# Patient Record
Sex: Male | Born: 1940 | Race: White | Hispanic: No | Marital: Married | Smoking: Former smoker
Health system: Southern US, Community
[De-identification: ages and names within clinical notes are randomized; demographics above are authoritative.]

## PROBLEM LIST (undated history)

## (undated) DIAGNOSIS — I499 Cardiac arrhythmia, unspecified: Secondary | ICD-10-CM

## (undated) DIAGNOSIS — M1990 Unspecified osteoarthritis, unspecified site: Secondary | ICD-10-CM

## (undated) DIAGNOSIS — I4891 Unspecified atrial fibrillation: Secondary | ICD-10-CM

## (undated) HISTORY — PX: APPENDECTOMY: SHX54

## (undated) HISTORY — PX: TONSILLECTOMY: SUR1361

## (undated) HISTORY — DX: Unspecified atrial fibrillation: I48.91

---

## 2014-08-19 ENCOUNTER — Other Ambulatory Visit: Payer: Self-pay | Admitting: Orthopedic Surgery

## 2014-09-02 NOTE — Pre-Procedure Instructions (Signed)
Leland HerRichard Carrigan  09/02/2014   Your procedure is scheduled on:  09/13/14  [facility] Admitting at 530 AM.  Call this number if you have problems the morning of surgery: [phone]   Remember:   Do not eat food or drink liquids after midnight.   Take these medicines the morning of surgery with A SIP OF WATER: none   Do not wear jewelry, make-up or nail polish.  Do not wear lotions, powders, or perfumes. You may wear deodorant.  Do not shave 48 hours prior to surgery. Men may shave face and neck.  Do not bring valuables to the hospital.  [facility] is not responsible                  for any belongings or valuables.               Contacts, dentures or bridgework may not be worn into surgery.  Leave suitcase in the car. After surgery it may be brought to your room.  For patients admitted to the hospital, discharge time is determined by your                treatment team.               Patients discharged the day of surgery will not be allowed to drive  home.  Name and phone number of your driver: family  Special Instructions: Shower using CHG 2 nights before surgery and the night before surgery.  If you shower the day of surgery use CHG.  Use special wash - you have one bottle of CHG for all showers.  You should use approximately 1/3 of the bottle for each shower.   Please read over the following fact sheets that you were given: Pain Booklet, Coughing and Deep Breathing, Blood Transfusion Information, MRSA Information and Surgical Site Infection Prevention

## 2014-09-03 ENCOUNTER — Encounter (HOSPITAL_COMMUNITY): Payer: Self-pay

## 2014-09-03 ENCOUNTER — Encounter (HOSPITAL_COMMUNITY)
Admission: RE | Admit: 2014-09-03 | Discharge: 2014-09-03 | Disposition: A | Discharge status: Home / Self Care | Payer: Medicare Other | Source: Ambulatory Visit | Attending: Orthopedic Surgery | Admitting: Orthopedic Surgery

## 2014-09-03 DIAGNOSIS — I493 Ventricular premature depolarization: Secondary | ICD-10-CM | POA: Diagnosis not present

## 2014-09-03 DIAGNOSIS — Z01818 Encounter for other preprocedural examination: Secondary | ICD-10-CM | POA: Insufficient documentation

## 2014-09-03 DIAGNOSIS — I771 Stricture of artery: Secondary | ICD-10-CM | POA: Diagnosis not present

## 2014-09-03 DIAGNOSIS — M1990 Unspecified osteoarthritis, unspecified site: Secondary | ICD-10-CM | POA: Diagnosis not present

## 2014-09-03 DIAGNOSIS — M5134 Other intervertebral disc degeneration, thoracic region: Secondary | ICD-10-CM | POA: Insufficient documentation

## 2014-09-03 DIAGNOSIS — I4891 Unspecified atrial fibrillation: Secondary | ICD-10-CM | POA: Diagnosis not present

## 2014-09-03 DIAGNOSIS — J449 Chronic obstructive pulmonary disease, unspecified: Secondary | ICD-10-CM | POA: Insufficient documentation

## 2014-09-03 DIAGNOSIS — F17210 Nicotine dependence, cigarettes, uncomplicated: Secondary | ICD-10-CM | POA: Insufficient documentation

## 2014-09-03 DIAGNOSIS — I482 Chronic atrial fibrillation: Secondary | ICD-10-CM | POA: Insufficient documentation

## 2014-09-03 DIAGNOSIS — I517 Cardiomegaly: Secondary | ICD-10-CM | POA: Insufficient documentation

## 2014-09-03 DIAGNOSIS — Z7901 Long term (current) use of anticoagulants: Secondary | ICD-10-CM | POA: Diagnosis not present

## 2014-09-03 HISTORY — DX: Cardiac arrhythmia, unspecified: I49.9

## 2014-09-03 HISTORY — DX: Unspecified osteoarthritis, unspecified site: M19.90

## 2014-09-03 LAB — COMPREHENSIVE METABOLIC PANEL
ALT: 18 U/L (ref 0–53)
ANION GAP: 6 (ref 5–15)
AST: 25 U/L (ref 0–37)
Albumin: 3.8 g/dL (ref 3.5–5.2)
Alkaline Phosphatase: 56 U/L (ref 39–117)
BUN: 15 mg/dL (ref 6–23)
CHLORIDE: 106 meq/L (ref 96–112)
CO2: 27 mmol/L (ref 19–32)
CREATININE: 1.19 mg/dL (ref 0.50–1.35)
Calcium: 9 mg/dL (ref 8.4–10.5)
GFR calc Af Amer: 68 mL/min — ABNORMAL LOW (ref >90)
GFR calc non Af Amer: 58 mL/min — ABNORMAL LOW (ref >90)
Glucose, Bld: 103 mg/dL — ABNORMAL HIGH (ref 70–99)
Potassium: 4.4 mmol/L (ref 3.5–5.1)
Sodium: 139 mmol/L (ref 135–145)
Total Bilirubin: 0.8 mg/dL (ref 0.3–1.2)
Total Protein: 6.7 g/dL (ref 6.0–8.3)

## 2014-09-03 LAB — CBC WITH DIFFERENTIAL/PLATELET
BASOS PCT: 0 % (ref 0–1)
Basophils Absolute: 0 10*3/uL (ref 0.0–0.1)
EOS ABS: 0.2 10*3/uL (ref 0.0–0.7)
Eosinophils Relative: 3 % (ref 0–5)
HEMATOCRIT: 44.9 % (ref 39.0–52.0)
Hemoglobin: 15.5 g/dL (ref 13.0–17.0)
Lymphocytes Relative: 30 % (ref 12–46)
Lymphs Abs: 1.8 10*3/uL (ref 0.7–4.0)
MCH: 33.1 pg (ref 26.0–34.0)
MCHC: 34.5 g/dL (ref 30.0–36.0)
MCV: 95.9 fL (ref 78.0–100.0)
MONO ABS: 0.4 10*3/uL (ref 0.1–1.0)
Monocytes Relative: 7 % (ref 3–12)
NEUTROS ABS: 3.5 10*3/uL (ref 1.7–7.7)
Neutrophils Relative %: 60 % (ref 43–77)
Platelets: 134 10*3/uL — ABNORMAL LOW (ref 150–400)
RBC: 4.68 MIL/uL (ref 4.22–5.81)
RDW: 13.4 % (ref 11.5–15.5)
WBC: 6 10*3/uL (ref 4.0–10.5)

## 2014-09-03 LAB — URINALYSIS, ROUTINE W REFLEX MICROSCOPIC
BILIRUBIN URINE: NEGATIVE
GLUCOSE, UA: NEGATIVE mg/dL
HGB URINE DIPSTICK: NEGATIVE
Ketones, ur: NEGATIVE mg/dL
Leukocytes, UA: NEGATIVE
Nitrite: NEGATIVE
PH: 5.5 (ref 5.0–8.0)
PROTEIN: 30 mg/dL — AB
SPECIFIC GRAVITY, URINE: 1.02 (ref 1.005–1.030)
Urobilinogen, UA: 0.2 mg/dL (ref 0.0–1.0)

## 2014-09-03 LAB — PROTIME-INR
INR: 2.02 — ABNORMAL HIGH (ref 0.00–1.49)
PROTHROMBIN TIME: 23.1 s — AB (ref 11.6–15.2)

## 2014-09-03 LAB — TYPE AND SCREEN
ABO/RH(D): AB POS
Antibody Screen: NEGATIVE

## 2014-09-03 LAB — URINE MICROSCOPIC-ADD ON

## 2014-09-03 LAB — APTT: APTT: 35 s (ref 24–37)

## 2014-09-03 LAB — SURGICAL PCR SCREEN
MRSA, PCR: NEGATIVE
STAPHYLOCOCCUS AUREUS: NEGATIVE

## 2014-09-03 LAB — ABO/RH: ABO/RH(D): AB POS

## 2014-09-03 NOTE — Progress Notes (Signed)
Will request ekg,stress test from dr Valentina LucksGriffin

## 2014-09-04 NOTE — Progress Notes (Signed)
Anesthesia Chart Review:  Patient is a 73 year old male scheduled for left TKA on 09/13/13 by Dr. Luiz BlareGraves.  History includes smoking, chronic afib (on Coumadin), arthritis, tonsillectomy, appendectomy. BMI is 29.39. PCP is Dr. Kirby FunkJohn Griffin. No current cardiologist.  09/03/14 EKG: afib at 61 bpm, occasional PVC, ST/T wave abnormality, consider inferolateral ischemia. Inferior T wave abnormality appears stable with lateral T wave abnormality slightly more pronounced since comparison EKG from Dr. Jone BasemanGriffin's office (no date, but patient's age then was listed as 64 years).   CXR 09/03/14: COPD. There is mild enlargement of the cardiac silhouette with prominence of the central pulmonary vascularity which may reflect low-grade CHF. There is no evidence of pneumonia.  Preoperative labs noted. He reported being told to hold Coumadin 5 days prior to surgery. Repeat PT/INR preoperatively.    I reviewed history, EKGs and last PCP office note with anesthesiologist Dr. Aleene DavidsonE. Fitzgerald.  Patient with chronic afib and no CV symptoms at his last office visit.  His EKG is felt to be overall stable.  Anesthesiologist recommendation to contact patient, and if no new CV/CHF symptoms then plan to proceed.  I did call patient.  He reports that he was diagnosed with afib 15-20 years ago when he lived in [place].  He said his work-up then included an echocardiogram.  He reports a stress test later but this was > 5 years ago.  [place] and has been seeing Dr. Valentina LucksGriffin for ~ 9 years.  He continues to deny chest pain, SOB, edema, significant palpitations.  He goes to the gym 3 days per week and is able to ride a recubant bicycle for 30 minutes at a time without CV symptoms.  No known CAD or CHF.    Based on above, anticipate that if no acute changes then he can proceed as planned.  Definitive anesthesia plan to be determined on the day of surgery following anesthesiologist evaluation.  Velna Ochsllison Zelenak, PA-C [facility]  Short Stay Center/Anesthesiology Phone [phone] 09/04/2014 3:36 PM

## 2014-09-12 MED ORDER — CEFAZOLIN SODIUM-DEXTROSE 2-3 GM-% IV SOLR
2.0000 g | INTRAVENOUS | Status: AC
  Administered 2014-09-13: 2 g via INTRAVENOUS
  Filled 2014-09-12: qty 50

## 2014-09-12 MED ORDER — CHLORHEXIDINE GLUCONATE 4 % EX LIQD
60.0000 mL | Freq: Once | CUTANEOUS | Status: DC
  Filled 2014-09-12: qty 60

## 2014-09-13 ENCOUNTER — Encounter (HOSPITAL_COMMUNITY)
Admission: RE | Disposition: A | Discharge status: Home / Self Care | Payer: Self-pay | Source: Ambulatory Visit | Attending: Orthopedic Surgery

## 2014-09-13 ENCOUNTER — Inpatient Hospital Stay (HOSPITAL_COMMUNITY)
Admission: RE | Admit: 2014-09-13 | Discharge: 2014-09-16 | DRG: 470 | Disposition: A | Discharge status: Home / Self Care | Payer: Medicare Other | Source: Ambulatory Visit | Attending: Orthopedic Surgery | Admitting: Orthopedic Surgery

## 2014-09-13 ENCOUNTER — Encounter (HOSPITAL_COMMUNITY): Payer: Self-pay | Admitting: *Deleted

## 2014-09-13 ENCOUNTER — Inpatient Hospital Stay (HOSPITAL_COMMUNITY): Payer: Medicare Other | Admitting: Vascular Surgery

## 2014-09-13 ENCOUNTER — Inpatient Hospital Stay (HOSPITAL_COMMUNITY): Payer: Medicare Other | Admitting: Anesthesiology

## 2014-09-13 DIAGNOSIS — M1712 Unilateral primary osteoarthritis, left knee: Secondary | ICD-10-CM | POA: Diagnosis present

## 2014-09-13 DIAGNOSIS — F17210 Nicotine dependence, cigarettes, uncomplicated: Secondary | ICD-10-CM | POA: Diagnosis present

## 2014-09-13 DIAGNOSIS — R339 Retention of urine, unspecified: Secondary | ICD-10-CM | POA: Diagnosis present

## 2014-09-13 DIAGNOSIS — F1010 Alcohol abuse, uncomplicated: Secondary | ICD-10-CM | POA: Diagnosis present

## 2014-09-13 DIAGNOSIS — M25562 Pain in left knee: Secondary | ICD-10-CM | POA: Diagnosis present

## 2014-09-13 HISTORY — PX: TOTAL KNEE ARTHROPLASTY: SHX125

## 2014-09-13 LAB — PROTIME-INR
INR: 1.05 (ref 0.00–1.49)
Prothrombin Time: 13.8 seconds (ref 11.6–15.2)

## 2014-09-13 SURGERY — ARTHROPLASTY, KNEE, TOTAL
Anesthesia: Spinal | Site: Knee | Laterality: Left

## 2014-09-13 MED ORDER — BUPIVACAINE LIPOSOME 1.3 % IJ SUSP
INTRAMUSCULAR | Status: DC | PRN
  Administered 2014-09-13: 20 mL

## 2014-09-13 MED ORDER — PROPOFOL INFUSION 10 MG/ML OPTIME
INTRAVENOUS | Status: DC | PRN
  Administered 2014-09-13: 150 ug/kg/min via INTRAVENOUS

## 2014-09-13 MED ORDER — OXYCODONE-ACETAMINOPHEN 5-325 MG PO TABS: 1.0000 | ORAL_TABLET | Freq: Four times a day (QID) | ORAL | Status: DC | PRN

## 2014-09-13 MED ORDER — MIDAZOLAM HCL 2 MG/2ML IJ SOLN
INTRAMUSCULAR | Status: AC
Start: 2014-09-13 — End: 2014-09-13
  Filled 2014-09-13: qty 2

## 2014-09-13 MED ORDER — HYDROMORPHONE HCL 1 MG/ML IJ SOLN
1.0000 mg | Freq: Once | INTRAMUSCULAR | Status: AC
  Administered 2014-09-13: 1 mg via INTRAVENOUS

## 2014-09-13 MED ORDER — METHOCARBAMOL 1000 MG/10ML IJ SOLN
500.0000 mg | Freq: Four times a day (QID) | INTRAVENOUS | Status: DC | PRN
  Filled 2014-09-13: qty 5

## 2014-09-13 MED ORDER — SODIUM CHLORIDE 0.9 % IV SOLN
INTRAVENOUS | Status: DC
  Administered 2014-09-13 (×2): via INTRAVENOUS

## 2014-09-13 MED ORDER — TRANEXAMIC ACID 100 MG/ML IV SOLN
2000.0000 mg | INTRAVENOUS | Status: AC
  Administered 2014-09-13: 2000 mg via TOPICAL
  Filled 2014-09-13: qty 20

## 2014-09-13 MED ORDER — METHOCARBAMOL 500 MG PO TABS
500.0000 mg | ORAL_TABLET | Freq: Four times a day (QID) | ORAL | Status: DC | PRN
  Administered 2014-09-13 – 2014-09-16 (×8): 500 mg via ORAL
  Filled 2014-09-13 (×7): qty 1

## 2014-09-13 MED ORDER — PROMETHAZINE HCL 25 MG/ML IJ SOLN: 6.2500 mg | INTRAMUSCULAR | Status: DC | PRN

## 2014-09-13 MED ORDER — ACETAMINOPHEN 650 MG RE SUPP: 650.0000 mg | Freq: Four times a day (QID) | RECTAL | Status: DC | PRN

## 2014-09-13 MED ORDER — ONDANSETRON HCL 4 MG PO TABS: 4.0000 mg | ORAL_TABLET | Freq: Four times a day (QID) | ORAL | Status: DC | PRN

## 2014-09-13 MED ORDER — OXYCODONE-ACETAMINOPHEN 5-325 MG PO TABS
ORAL_TABLET | ORAL | Status: AC
  Filled 2014-09-13: qty 2

## 2014-09-13 MED ORDER — KETOROLAC TROMETHAMINE 15 MG/ML IJ SOLN
15.0000 mg | Freq: Once | INTRAMUSCULAR | Status: AC
  Administered 2014-09-13: 15 mg via INTRAVENOUS

## 2014-09-13 MED ORDER — ACETAMINOPHEN 325 MG PO TABS: 650.0000 mg | ORAL_TABLET | Freq: Four times a day (QID) | ORAL | Status: DC | PRN

## 2014-09-13 MED ORDER — PROPOFOL 10 MG/ML IV BOLUS
INTRAVENOUS | Status: AC
Start: 2014-09-13 — End: 2014-09-13
  Filled 2014-09-13: qty 40

## 2014-09-13 MED ORDER — PHENYLEPHRINE HCL 10 MG/ML IJ SOLN
INTRAMUSCULAR | Status: DC | PRN
  Administered 2014-09-13: 80 ug via INTRAVENOUS

## 2014-09-13 MED ORDER — HYDROMORPHONE HCL 1 MG/ML IJ SOLN
0.2500 mg | INTRAMUSCULAR | Status: DC | PRN
  Administered 2014-09-13: 1 mg via INTRAVENOUS

## 2014-09-13 MED ORDER — BUPIVACAINE LIPOSOME 1.3 % IJ SUSP
20.0000 mL | Freq: Once | INTRAMUSCULAR | Status: DC
  Filled 2014-09-13: qty 20

## 2014-09-13 MED ORDER — LIDOCAINE HCL (CARDIAC) 20 MG/ML IV SOLN
INTRAVENOUS | Status: AC
  Filled 2014-09-13: qty 5

## 2014-09-13 MED ORDER — ALUM & MAG HYDROXIDE-SIMETH 200-200-20 MG/5ML PO SUSP
30.0000 mL | ORAL | Status: DC | PRN
  Administered 2014-09-14: 30 mL via ORAL
  Filled 2014-09-13: qty 30

## 2014-09-13 MED ORDER — TRANEXAMIC ACID 100 MG/ML IV SOLN
1000.0000 mg | INTRAVENOUS | Status: DC
  Filled 2014-09-13: qty 10

## 2014-09-13 MED ORDER — SUFENTANIL CITRATE 50 MCG/ML IV SOLN
INTRAVENOUS | Status: DC | PRN
  Administered 2014-09-13: 5 ug via INTRAVENOUS

## 2014-09-13 MED ORDER — BISACODYL 5 MG PO TBEC: 5.0000 mg | DELAYED_RELEASE_TABLET | Freq: Every day | ORAL | Status: DC | PRN

## 2014-09-13 MED ORDER — HYDROMORPHONE HCL 1 MG/ML IJ SOLN
INTRAMUSCULAR | Status: AC
  Filled 2014-09-13: qty 1

## 2014-09-13 MED ORDER — HYDROMORPHONE HCL 1 MG/ML IJ SOLN
1.0000 mg | INTRAMUSCULAR | Status: DC | PRN
  Administered 2014-09-14: 2 mg via INTRAVENOUS
  Filled 2014-09-13: qty 2

## 2014-09-13 MED ORDER — SUFENTANIL CITRATE 50 MCG/ML IV SOLN
INTRAVENOUS | Status: AC
  Filled 2014-09-13: qty 1

## 2014-09-13 MED ORDER — SODIUM CHLORIDE 0.9 % IR SOLN
Status: DC | PRN
  Administered 2014-09-13 (×3): 1000 mL

## 2014-09-13 MED ORDER — FLEET ENEMA 7-19 GM/118ML RE ENEM: 1.0000 | ENEMA | Freq: Once | RECTAL | Status: AC | PRN

## 2014-09-13 MED ORDER — WARFARIN SODIUM 5 MG PO TABS
5.0000 mg | ORAL_TABLET | ORAL | Status: DC
  Administered 2014-09-13 – 2014-09-16 (×2): 5 mg via ORAL
  Filled 2014-09-13 (×2): qty 1

## 2014-09-13 MED ORDER — PHENYLEPHRINE 40 MCG/ML (10ML) SYRINGE FOR IV PUSH (FOR BLOOD PRESSURE SUPPORT)
PREFILLED_SYRINGE | INTRAVENOUS | Status: AC
  Filled 2014-09-13: qty 10

## 2014-09-13 MED ORDER — BUPIVACAINE IN DEXTROSE 0.75-8.25 % IT SOLN
INTRATHECAL | Status: DC | PRN
  Administered 2014-09-13: 15 mg via INTRATHECAL

## 2014-09-13 MED ORDER — WARFARIN - PHYSICIAN DOSING INPATIENT: Freq: Every day | Status: DC

## 2014-09-13 MED ORDER — CEFAZOLIN SODIUM-DEXTROSE 2-3 GM-% IV SOLR
2.0000 g | Freq: Four times a day (QID) | INTRAVENOUS | Status: AC
  Administered 2014-09-13 (×2): 2 g via INTRAVENOUS
  Filled 2014-09-13 (×2): qty 50

## 2014-09-13 MED ORDER — KETOROLAC TROMETHAMINE 15 MG/ML IJ SOLN
INTRAMUSCULAR | Status: AC
  Administered 2014-09-13: 15 mg
  Filled 2014-09-13: qty 1

## 2014-09-13 MED ORDER — LACTATED RINGERS IV SOLN
INTRAVENOUS | Status: DC | PRN
  Administered 2014-09-13: 07:00:00 via INTRAVENOUS

## 2014-09-13 MED ORDER — SODIUM CHLORIDE 0.9 % IJ SOLN
INTRAMUSCULAR | Status: AC
  Filled 2014-09-13: qty 10

## 2014-09-13 MED ORDER — HYDROMORPHONE HCL 1 MG/ML IJ SOLN: 0.5000 mg | INTRAMUSCULAR | Status: DC | PRN

## 2014-09-13 MED ORDER — DIPHENHYDRAMINE HCL 12.5 MG/5ML PO ELIX: 12.5000 mg | ORAL_SOLUTION | ORAL | Status: DC | PRN

## 2014-09-13 MED ORDER — SUCCINYLCHOLINE CHLORIDE 20 MG/ML IJ SOLN
INTRAMUSCULAR | Status: AC
  Filled 2014-09-13: qty 1

## 2014-09-13 MED ORDER — MIDAZOLAM HCL 5 MG/5ML IJ SOLN
INTRAMUSCULAR | Status: DC | PRN
  Administered 2014-09-13: 2 mg via INTRAVENOUS

## 2014-09-13 MED ORDER — EPHEDRINE SULFATE 50 MG/ML IJ SOLN
INTRAMUSCULAR | Status: AC
  Filled 2014-09-13: qty 1

## 2014-09-13 MED ORDER — WARFARIN SODIUM 2.5 MG PO TABS: 2.5000 mg | ORAL_TABLET | Freq: Every day | ORAL | Status: DC

## 2014-09-13 MED ORDER — SODIUM CHLORIDE 0.9 % IJ SOLN
INTRAMUSCULAR | Status: AC
Start: 2014-09-13 — End: 2014-09-13
  Filled 2014-09-13: qty 10

## 2014-09-13 MED ORDER — METHOCARBAMOL 500 MG PO TABS
ORAL_TABLET | ORAL | Status: AC
  Filled 2014-09-13: qty 1

## 2014-09-13 MED ORDER — OXYCODONE-ACETAMINOPHEN 5-325 MG PO TABS
1.0000 | ORAL_TABLET | ORAL | Status: DC | PRN
  Administered 2014-09-13 – 2014-09-16 (×13): 2 via ORAL
  Filled 2014-09-13 (×13): qty 2

## 2014-09-13 MED ORDER — BUPIVACAINE HCL (PF) 0.5 % IJ SOLN
INTRAMUSCULAR | Status: DC | PRN
  Administered 2014-09-13: 20 mL

## 2014-09-13 MED ORDER — WARFARIN SODIUM 2.5 MG PO TABS
2.5000 mg | ORAL_TABLET | ORAL | Status: DC
  Administered 2014-09-14 – 2014-09-15 (×2): 2.5 mg via ORAL
  Filled 2014-09-13 (×2): qty 1

## 2014-09-13 MED ORDER — ROCURONIUM BROMIDE 50 MG/5ML IV SOLN
INTRAVENOUS | Status: AC
Start: 2014-09-13 — End: 2014-09-13
  Filled 2014-09-13: qty 1

## 2014-09-13 MED ORDER — ONDANSETRON HCL 4 MG/2ML IJ SOLN: 4.0000 mg | Freq: Four times a day (QID) | INTRAMUSCULAR | Status: DC | PRN

## 2014-09-13 MED ORDER — ONDANSETRON HCL 4 MG/2ML IJ SOLN
INTRAMUSCULAR | Status: DC | PRN
  Administered 2014-09-13: 4 mg via INTRAVENOUS

## 2014-09-13 MED ORDER — METHOCARBAMOL 500 MG PO TABS: 500.0000 mg | ORAL_TABLET | Freq: Three times a day (TID) | ORAL | Status: DC | PRN

## 2014-09-13 MED ORDER — POLYETHYLENE GLYCOL 3350 17 G PO PACK: 17.0000 g | PACK | Freq: Every day | ORAL | Status: DC | PRN

## 2014-09-13 MED ORDER — PHENYLEPHRINE HCL 10 MG/ML IJ SOLN
10.0000 mg | INTRAVENOUS | Status: DC | PRN
  Administered 2014-09-13: 40 ug/min via INTRAVENOUS

## 2014-09-13 MED ORDER — DOCUSATE SODIUM 100 MG PO CAPS
100.0000 mg | ORAL_CAPSULE | Freq: Two times a day (BID) | ORAL | Status: DC
  Administered 2014-09-13 – 2014-09-16 (×5): 100 mg via ORAL
  Filled 2014-09-13 (×6): qty 1

## 2014-09-13 MED ORDER — PROMETHAZINE HCL 25 MG/ML IJ SOLN: 6.2500 mg | Freq: Four times a day (QID) | INTRAMUSCULAR | Status: DC | PRN

## 2014-09-13 SURGICAL SUPPLY — 70 items
BAG DECANTER FOR FLEXI CONT (MISCELLANEOUS) ×3 IMPLANT
BANDAGE ESMARK 6X9 LF (GAUZE/BANDAGES/DRESSINGS) ×1 IMPLANT
BENZOIN TINCTURE PRP APPL 2/3 (GAUZE/BANDAGES/DRESSINGS) ×3 IMPLANT
BLADE SAGITTAL 25.0X1.19X90 (BLADE) ×2 IMPLANT
BLADE SAGITTAL 25.0X1.19X90MM (BLADE) ×1
BLADE SAW SAG 90X13X1.27 (BLADE) ×3 IMPLANT
BNDG ELASTIC 6X10 VLCR STRL LF (GAUZE/BANDAGES/DRESSINGS) ×3 IMPLANT
BNDG ESMARK 6X9 LF (GAUZE/BANDAGES/DRESSINGS) ×3
BOWL SMART MIX CTS (DISPOSABLE) ×3 IMPLANT
CAP KNEE TOTAL 3 SIGMA ×3 IMPLANT
CEMENT HV SMART SET (Cement) ×6 IMPLANT
CLOSURE WOUND 1/2 X4 (GAUZE/BANDAGES/DRESSINGS) ×1
COVER SURGICAL LIGHT HANDLE (MISCELLANEOUS) ×3 IMPLANT
CUFF TOURNIQUET SINGLE 34IN LL (TOURNIQUET CUFF) ×3 IMPLANT
CUFF TOURNIQUET SINGLE 44IN (TOURNIQUET CUFF) IMPLANT
DRAPE EXTREMITY T 121X128X90 (DRAPE) ×3 IMPLANT
DRAPE IMP U-DRAPE 54X76 (DRAPES) ×3 IMPLANT
DRAPE U-SHAPE 47X51 STRL (DRAPES) ×3 IMPLANT
DRSG MEPILEX BORDER 4X8 (GAUZE/BANDAGES/DRESSINGS) ×3 IMPLANT
DRSG PAD ABDOMINAL 8X10 ST (GAUZE/BANDAGES/DRESSINGS) ×3 IMPLANT
DURAPREP 26ML APPLICATOR (WOUND CARE) ×3 IMPLANT
ELECT REM PT RETURN 9FT ADLT (ELECTROSURGICAL) ×3
ELECTRODE REM PT RTRN 9FT ADLT (ELECTROSURGICAL) ×1 IMPLANT
EVACUATOR 1/8 PVC DRAIN (DRAIN) ×3 IMPLANT
FACESHIELD WRAPAROUND (MASK) ×3 IMPLANT
GAUZE SPONGE 4X4 12PLY STRL (GAUZE/BANDAGES/DRESSINGS) ×3 IMPLANT
GAUZE XEROFORM 5X9 LF (GAUZE/BANDAGES/DRESSINGS) ×3 IMPLANT
GLOVE BIO SURGEON STRL SZ 6.5 (GLOVE) ×2 IMPLANT
GLOVE BIO SURGEONS STRL SZ 6.5 (GLOVE) ×1
GLOVE BIOGEL PI IND STRL 6.5 (GLOVE) ×1 IMPLANT
GLOVE BIOGEL PI IND STRL 7.5 (GLOVE) ×1 IMPLANT
GLOVE BIOGEL PI IND STRL 8 (GLOVE) ×2 IMPLANT
GLOVE BIOGEL PI INDICATOR 6.5 (GLOVE) ×2
GLOVE BIOGEL PI INDICATOR 7.5 (GLOVE) ×2
GLOVE BIOGEL PI INDICATOR 8 (GLOVE) ×4
GLOVE ECLIPSE 7.5 STRL STRAW (GLOVE) ×6 IMPLANT
GLOVE SURG SS PI 6.5 STRL IVOR (GLOVE) ×3 IMPLANT
GOWN STRL REUS W/ TWL LRG LVL3 (GOWN DISPOSABLE) ×1 IMPLANT
GOWN STRL REUS W/ TWL XL LVL3 (GOWN DISPOSABLE) ×3 IMPLANT
GOWN STRL REUS W/TWL LRG LVL3 (GOWN DISPOSABLE) ×2
GOWN STRL REUS W/TWL XL LVL3 (GOWN DISPOSABLE) ×6
HANDPIECE INTERPULSE COAX TIP (DISPOSABLE) ×2
HOOD PEEL AWAY FACE SHEILD DIS (HOOD) ×6 IMPLANT
IMMOBILIZER KNEE 20 (SOFTGOODS) IMPLANT
IMMOBILIZER KNEE 22 UNIV (SOFTGOODS) ×3 IMPLANT
KIT BASIN OR (CUSTOM PROCEDURE TRAY) ×3 IMPLANT
KIT ROOM TURNOVER OR (KITS) ×3 IMPLANT
MANIFOLD NEPTUNE II (INSTRUMENTS) ×3 IMPLANT
NEEDLE SPNL 22GX3.5 QUINCKE BK (NEEDLE) ×3 IMPLANT
NS IRRIG 1000ML POUR BTL (IV SOLUTION) ×3 IMPLANT
PACK TOTAL JOINT (CUSTOM PROCEDURE TRAY) ×3 IMPLANT
PACK UNIVERSAL I (CUSTOM PROCEDURE TRAY) ×3 IMPLANT
PAD ARMBOARD 7.5X6 YLW CONV (MISCELLANEOUS) ×3 IMPLANT
PAD CAST 4YDX4 CTTN HI CHSV (CAST SUPPLIES) ×1 IMPLANT
PADDING CAST COTTON 4X4 STRL (CAST SUPPLIES) ×2
PADDING CAST COTTON 6X4 STRL (CAST SUPPLIES) ×3 IMPLANT
SET HNDPC FAN SPRY TIP SCT (DISPOSABLE) ×1 IMPLANT
STAPLER VISISTAT 35W (STAPLE) IMPLANT
STRIP CLOSURE SKIN 1/2X4 (GAUZE/BANDAGES/DRESSINGS) ×2 IMPLANT
SUCTION FRAZIER TIP 10 FR DISP (SUCTIONS) ×3 IMPLANT
SUT MNCRL AB 3-0 PS2 18 (SUTURE) ×3 IMPLANT
SUT VIC AB 0 CTB1 27 (SUTURE) ×6 IMPLANT
SUT VIC AB 1 CT1 27 (SUTURE) ×4
SUT VIC AB 1 CT1 27XBRD ANBCTR (SUTURE) ×2 IMPLANT
SUT VIC AB 2-0 CTB1 (SUTURE) ×6 IMPLANT
SYR 50ML LL SCALE MARK (SYRINGE) ×3 IMPLANT
SYR CONTROL 10ML LL (SYRINGE) IMPLANT
SYRINGE 20CC LL (MISCELLANEOUS) ×3 IMPLANT
TOWEL OR 17X24 6PK STRL BLUE (TOWEL DISPOSABLE) ×3 IMPLANT
TOWEL OR 17X26 10 PK STRL BLUE (TOWEL DISPOSABLE) ×3 IMPLANT

## 2014-09-13 NOTE — Discharge Instructions (Signed)
Total Knee Replacement, Care After °Refer to this sheet in the next few weeks. These instructions provide you with information on caring for yourself after your procedure. Your health care provider also may give you specific instructions. Your treatment has been planned according to the most current medical practices, but problems sometimes occur. Call your health care provider if you have any problems or questions after your procedure. °HOME CARE INSTRUCTIONS  °· See a physical therapist as directed by your health care provider. °· Take medicines only as directed by your health care provider. °· Avoid lifting or driving until you are instructed otherwise. °· If you have been sent home with a continuous passive motion machine, use it as directed by your health care provider. °SEEK MEDICAL CARE IF: °· You have difficulty breathing. °· You have drainage, redness, swelling, or pain at your incision site. °· You have a bad smell coming from your incision site. °· You have persistent bleeding from your incision site. °· Your incision breaks open after sutures (stitches) or staples have been removed. °· You have a fever. °SEEK IMMEDIATE MEDICAL CARE IF:  °· You have a rash. °· You have pain or swelling in your calf or thigh. °· You have shortness of breath or chest pain. °· Your range of motion in your knee is decreasing rather than increasing. °MAKE SURE YOU:  °· Understand these instructions. °· Will watch your condition. °· Will get help right away if you are not doing well or get worse. °Document Released: 03/12/2005 Document Revised: 01/07/2014 Document Reviewed: 10/12/2011 °ExitCare® Patient Information ©2015 [employer]. This information is not intended to replace advice given to you by your health care provider. Make sure you discuss any questions you have with your health care provider. ° °

## 2014-09-13 NOTE — Plan of Care (Signed)
Problem: Consults Goal: Diagnosis- Total Joint Replacement Primary Total Knee Left     

## 2014-09-13 NOTE — Brief Op Note (Signed)
09/13/2014  9:21 AM  PATIENT:  Jesse Quinn  74 y.o. male  PRE-OPERATIVE DIAGNOSIS:  degenerative joint disease, left knee  POST-OPERATIVE DIAGNOSIS:  degenerative joint disease, left knee  PROCEDURE:  Procedure(s): TOTAL KNEE ARTHROPLASTY (Left)  SURGEON:  Surgeon(s) and Role:    * Harvie JuniorJohn L Lennie Vasco, MD - Primary  PHYSICIAN ASSISTANT:   ASSISTANTS: bethune   ANESTHESIA:   general  EBL:  Total I/O In: 1700 [I.V.:1700] Out: 200 [Blood:200]  BLOOD ADMINISTERED:none  DRAINS: (1) Hemovact drain(s) in the l knee with  Suction Open   LOCAL MEDICATIONS USED:  MARCAINE     SPECIMEN:  No Specimen  DISPOSITION OF SPECIMEN:  N/A  COUNTS:  YES  TOURNIQUET:   Total Tourniquet Time Documented: Thigh (Left) - 56 minutes Total: Thigh (Left) - 56 minutes   DICTATION: .Other Dictation: Dictation Number [id number]  PLAN OF CARE: Admit to inpatient   PATIENT DISPOSITION:  PACU - hemodynamically stable.   Delay start of Pharmacological VTE agent (>24hrs) due to surgical blood loss or risk of bleeding: no

## 2014-09-13 NOTE — Evaluation (Signed)
Physical Therapy Evaluation Patient Details Name: Jesse Quinn MRN: [id number] DOB: [birth year] Today's Date: 09/13/2014   History of Present Illness  Patient is a 74 y/o male s/p L TKA.  Clinical Impression  Patient presents with decreased independence with mobility due to deficits listed in PT problem list.  He will benefit from skilled PT in the acute setting to allow return home with family assist and HHPT.    Follow Up Recommendations Home health PT;Supervision for mobility/OOB    Equipment Recommendations  Rolling walker with 5" wheels    Recommendations for Other Services       Precautions / Restrictions Precautions Precautions: Knee Required Braces or Orthoses: Knee Immobilizer - Left Knee Immobilizer - Left: On when out of bed or walking      Mobility  Bed Mobility Overal bed mobility: Modified Independent (HOB elevated)                Transfers Overall transfer level: Needs assistance Equipment used: Rolling walker (2 wheeled) Transfers: Sit to/from [employer] Sit to Stand: Min assist Stand pivot transfers: Min assist       General transfer comment: assist for safety, cues for technique; pivot with walker to chair cues for UEweight bearing due to numbness in LLE; no noted buckling  Ambulation/Gait                Stairs            Wheelchair Mobility    Modified Rankin (Stroke Patients Only)       Balance Overall balance assessment: Needs assistance         Standing balance support: Bilateral upper extremity supported Standing balance-Leahy Scale: Poor Standing balance comment: due to LLE weakness                             Pertinent Vitals/Pain Pain Assessment: No/denies pain (reports leg still tingling and numb)    Home Living Family/patient expects to be discharged to:: Private residence Living Arrangements: Spouse/significant other Available Help at Discharge: Family Type of Home:  House Home Access: Stairs to enter Entrance Stairs-Rails: None Secretary/administrator of Steps: 3 Home Layout: One level Home Equipment: None      Prior Function Level of Independence: Independent               Hand Dominance        Extremity/Trunk Assessment               Lower Extremity Assessment: RLE deficits/detail;LLE deficits/detail RLE Deficits / Details: WFL LLE Deficits / Details: AAROM 0-approx70  degrees left knee; quad strength at least 3/5     Communication   Communication: No difficulties  Cognition Arousal/Alertness: Awake/alert Behavior During Therapy: WFL for tasks assessed/performed Overall Cognitive Status: Within Functional Limits for tasks assessed                      General Comments      Exercises Total Joint Exercises Ankle Circles/Pumps: AROM;Both;10 reps;Supine Quad Sets: AROM;Both;10 reps;Supine Short Arc Quad: AROM;Left;10 reps;Supine Heel Slides: AAROM;Left;10 reps;Supine;AROM      Assessment/Plan    PT Assessment Patient needs continued PT services  PT Diagnosis Difficulty walking;Acute pain   PT Problem List Decreased strength;Decreased mobility;Decreased range of motion;Decreased activity tolerance;Decreased balance;Pain;Decreased knowledge of use of DME;Decreased knowledge of precautions  PT Treatment Interventions DME instruction;Therapeutic exercise;Gait training;Stair training;Functional mobility training;Therapeutic activities;Patient/family education   PT  Goals (Current goals can be found in the Care Plan section) Acute Rehab PT Goals Patient Stated Goal: To return to independent PT Goal Formulation: With patient Time For Goal Achievement: 09/19/14 Potential to Achieve Goals: Good    Frequency 7X/week   Barriers to discharge        Co-evaluation               End of Session Equipment Utilized During Treatment: Gait belt;Left knee immobilizer Activity Tolerance: Patient tolerated  treatment well Patient left: in chair;with call bell/phone within reach           Time: 1515-1545 PT Time Calculation (min) (ACUTE ONLY): 30 min   Charges:   PT Evaluation $Initial PT Evaluation Tier I: 1 Procedure PT Treatments $Therapeutic Exercise: 8-22 mins $Therapeutic Activity: 8-22 mins   PT G Codes:        Younis Mathey,CYNDI 09/13/2014, 4:48 PM Sheran Lawlessyndi Slaton Reaser, PT [phone] 09/13/2014

## 2014-09-13 NOTE — Progress Notes (Signed)
Orthopedic Tech Progress Note Patient Details:  Jesse DoneRichard Quinn 25-Aug-1941 [id number] CPM applied to LLE with appropriate settings. OHF applied to bed.  CPM Left Knee CPM Left Knee: On Left Knee Flexion (Degrees): 90 Left Knee Extension (Degrees): 0   Asia R Thompson 09/13/2014, 10:34 AM

## 2014-09-13 NOTE — Progress Notes (Signed)
Prior to leaving pacu able to bend r knee to 90 degrees and assit in moving about bed . Spinal level for nomal sensation mid thigh on (L) and dr Jacklynn Buemassagee aware/ ok to transfer

## 2014-09-13 NOTE — Anesthesia Postprocedure Evaluation (Signed)
  Anesthesia Post-op Note  Patient: Jesse Quinn  Procedure(s) Performed: Procedure(s): TOTAL KNEE ARTHROPLASTY (Left)  Patient Location: PACU  Anesthesia Type:Spinal  Level of Consciousness: awake and alert   Airway and Oxygen Therapy: Patient Spontanous Breathing  Post-op Pain: mild  Post-op Assessment: Post-op Vital signs reviewed  Post-op Vital Signs: stable  Last Vitals:  Filed Vitals:   09/13/14 1000  BP: 106/58  Pulse: 40  Temp:   Resp: 17    Complications: No apparent anesthesia complications

## 2014-09-13 NOTE — Anesthesia Procedure Notes (Addendum)
Spinal Patient location during procedure: OR Start time: 09/13/2014 7:20 AM End time: 09/13/2014 7:30 AM Preanesthetic Checklist Completed: patient identified, site marked, surgical consent, pre-op evaluation, timeout performed, IV checked, risks and benefits discussed and monitors and equipment checked Spinal Block Patient position: sitting Prep: Betadine Approach: midline Location: L3-4 Needle Needle type: Tuohy  Needle gauge: 25 G Needle length: 9 cm Needle insertion depth: 5 cm Assessment Sensory level: T6 Additional Notes Tolerated well  Date/Time: 09/13/2014 7:35 AM Performed by: Coralee RudFLORES, Wajiha Versteeg Pre-anesthesia Checklist: Patient identified, Emergency Drugs available, Suction available and Patient being monitored Patient Re-evaluated:Patient Re-evaluated prior to inductionOxygen Delivery Method: Simple face mask Intubation Type: IV induction Comments: Natural airway, spontaneous breathing,

## 2014-09-13 NOTE — Anesthesia Preprocedure Evaluation (Signed)
Anesthesia Evaluation  Patient identified by MRN, date of birth, ID band Patient awake    Reviewed: Allergy & Precautions, Patient's Chart, lab work & pertinent test results  Airway        Dental   Pulmonary Current Smoker,  breath sounds clear to auscultation        Cardiovascular + dysrhythmias Rate:Normal     Neuro/Psych    GI/Hepatic negative GI ROS, Neg liver ROS,   Endo/Other  negative endocrine ROS  Renal/GU negative Renal ROS     Musculoskeletal  (+) Arthritis -,   Abdominal   Peds  Hematology   Anesthesia Other Findings   Reproductive/Obstetrics                             Anesthesia Physical Anesthesia Plan  ASA: II  Anesthesia Plan: Spinal   Post-op Pain Management:    Induction:   Airway Management Planned: Natural Airway  Additional Equipment:   Intra-op Plan:   Post-operative Plan:   Informed Consent: I have reviewed the patients History and Physical, chart, labs and discussed the procedure including the risks, benefits and alternatives for the proposed anesthesia with the patient or authorized representative who has indicated his/her understanding and acceptance.   Dental advisory given  Plan Discussed with: CRNA and Surgeon  Anesthesia Plan Comments:         Anesthesia Quick Evaluation

## 2014-09-13 NOTE — H&P (Signed)
TOTAL KNEE ADMISSION H&P  Patient is being admitted for left total knee arthroplasty.  Subjective:  Chief Complaint:left knee pain.  HPI: Jesse Quinn, 74 y.o. male, has a history of pain and functional disability in the left knee due to arthritis and has failed non-surgical conservative treatments for greater than 12 weeks to includeNSAID's and/or analgesics, corticosteriod injections, flexibility and strengthening excercises, use of assistive devices and activity modification.  Onset of symptoms was gradual, starting 7 years ago with gradually worsening course since that time. The patient noted no past surgery on the left knee(s).  Patient currently rates pain in the left knee(s) at 8 out of 10 with activity. Patient has night pain, worsening of pain with activity and weight bearing, pain that interferes with activities of daily living, pain with passive range of motion and joint swelling.  Patient has evidence of subchondral cysts, subchondral sclerosis, joint subluxation and joint space narrowing by imaging studies. This patient has had failure of conservative care. There is no active infection.  There are no active problems to display for this patient.  Past Medical History  Diagnosis Date  . Dysrhythmia     AF     . Arthritis     Past Surgical History  Procedure Laterality Date  . Tonsillectomy    . Appendectomy      Prescriptions prior to admission  Medication Sig Dispense Refill Last Dose  . warfarin (COUMADIN) 5 MG tablet Take 2.5-5 mg by mouth daily. 58m daily on Monday Wednesday and Friday and 2.568mdaily all other days.   Past Week at Unknown time   No Known Allergies  History  Substance Use Topics  . Smoking status: Current Every Day Smoker    Types: Cigars  . Smokeless tobacco: Not on file  . Alcohol Use: Yes     Comment: weekly    History reviewed. No pertinent family history.   ROS ROS: I have reviewed the patient's review of systems thoroughly and there are  no positive responses as relates to the HPI.  Objective:  Physical Exam  Vital signs in last 24 hours: Temp:  [97.9 F (36.6 C)] 97.9 F (36.6 C) (01/08 0557) Pulse Rate:  [50] 50 (01/08 0557) Resp:  [18] 18 (01/08 0557) BP: (134)/(83) 134/83 mmHg (01/08 0557) SpO2:  [100 %] 100 % (01/08 0557) Weight:  [215 lb (97.523 kg)] 215 lb (97.523 kg) (01/08 0557) Well-developed well-nourished patient in no acute distress. Alert and oriented x3 HEENT:within normal limits Cardiac: Regular rate and rhythm Pulmonary: Lungs clear to auscultation Abdomen: Soft and nontender.  Normal active bowel sounds  Musculoskeletal: (L Knee: painful rom// rom 0-100 med jt line tender +varus mal-alignment Labs: Recent Results (from the past 2160 hour(s))  Type and screen     Status: None   Collection Time: 09/03/14  8:35 AM  Result Value Ref Range   ABO/RH(D) AB POS    Antibody Screen NEG    Sample Expiration 09/17/2014   ABO/Rh     Status: None   Collection Time: 09/03/14  8:35 AM  Result Value Ref Range   ABO/RH(D) AB POS   Surgical pcr screen     Status: None   Collection Time: 09/03/14  8:40 AM  Result Value Ref Range   MRSA, PCR NEGATIVE NEGATIVE   Staphylococcus aureus NEGATIVE NEGATIVE    Comment:        The Xpert SA Assay (FDA approved for NASAL specimens in patients over 2189ears of age), is one  component of a comprehensive surveillance program.  Test performance has been validated by [facility] for patients greater than or equal to 87 year old. It is not intended to diagnose infection nor to guide or monitor treatment.   APTT     Status: None   Collection Time: 09/03/14  8:40 AM  Result Value Ref Range   aPTT 35 24 - 37 seconds  CBC WITH DIFFERENTIAL     Status: Abnormal   Collection Time: 09/03/14  8:40 AM  Result Value Ref Range   WBC 6.0 4.0 - 10.5 K/uL   RBC 4.68 4.22 - 5.81 MIL/uL   Hemoglobin 15.5 13.0 - 17.0 g/dL   HCT 44.9 39.0 - 52.0 %   MCV 95.9 78.0 - 100.0  fL   MCH 33.1 26.0 - 34.0 pg   MCHC 34.5 30.0 - 36.0 g/dL   RDW 13.4 11.5 - 15.5 %   Platelets 134 (L) 150 - 400 K/uL   Neutrophils Relative % 60 43 - 77 %   Neutro Abs 3.5 1.7 - 7.7 K/uL   Lymphocytes Relative 30 12 - 46 %   Lymphs Abs 1.8 0.7 - 4.0 K/uL   Monocytes Relative 7 3 - 12 %   Monocytes Absolute 0.4 0.1 - 1.0 K/uL   Eosinophils Relative 3 0 - 5 %   Eosinophils Absolute 0.2 0.0 - 0.7 K/uL   Basophils Relative 0 0 - 1 %   Basophils Absolute 0.0 0.0 - 0.1 K/uL  Comprehensive metabolic panel     Status: Abnormal   Collection Time: 09/03/14  8:40 AM  Result Value Ref Range   Sodium 139 135 - 145 mmol/L    Comment: Please note change in reference range.   Potassium 4.4 3.5 - 5.1 mmol/L    Comment: Please note change in reference range.   Chloride 106 96 - 112 mEq/L   CO2 27 19 - 32 mmol/L   Glucose, Bld 103 (H) 70 - 99 mg/dL   BUN 15 6 - 23 mg/dL   Creatinine, Ser 1.19 0.50 - 1.35 mg/dL   Calcium 9.0 8.4 - 10.5 mg/dL   Total Protein 6.7 6.0 - 8.3 g/dL   Albumin 3.8 3.5 - 5.2 g/dL   AST 25 0 - 37 U/L   ALT 18 0 - 53 U/L   Alkaline Phosphatase 56 39 - 117 U/L   Total Bilirubin 0.8 0.3 - 1.2 mg/dL   GFR calc non Af Amer 58 (L) >90 mL/min   GFR calc Af Amer 68 (L) >90 mL/min    Comment: (NOTE) The eGFR has been calculated using the CKD EPI equation. This calculation has not been validated in all clinical situations. eGFR's persistently <90 mL/min signify possible Chronic Kidney Disease.    Anion gap 6 5 - 15  Protime-INR     Status: Abnormal   Collection Time: 09/03/14  8:40 AM  Result Value Ref Range   Prothrombin Time 23.1 (H) 11.6 - 15.2 seconds   INR 2.02 (H) 0.00 - 1.49  Urinalysis, Routine w reflex microscopic     Status: Abnormal   Collection Time: 09/03/14  8:40 AM  Result Value Ref Range   Color, Urine YELLOW YELLOW   APPearance CLEAR CLEAR   Specific Gravity, Urine 1.020 1.005 - 1.030   pH 5.5 5.0 - 8.0   Glucose, UA NEGATIVE NEGATIVE mg/dL   Hgb  urine dipstick NEGATIVE NEGATIVE   Bilirubin Urine NEGATIVE NEGATIVE   Ketones, ur NEGATIVE NEGATIVE mg/dL  Protein, ur 30 (A) NEGATIVE mg/dL   Urobilinogen, UA 0.2 0.0 - 1.0 mg/dL   Nitrite NEGATIVE NEGATIVE   Leukocytes, UA NEGATIVE NEGATIVE  Urine microscopic-add on     Status: None   Collection Time: 09/03/14  8:40 AM  Result Value Ref Range   Squamous Epithelial / LPF RARE RARE   WBC, UA 0-2 <3 WBC/hpf   RBC / HPF 0-2 <3 RBC/hpf   Bacteria, UA RARE RARE  Protime-INR     Status: None   Collection Time: 09/13/14  5:48 AM  Result Value Ref Range   Prothrombin Time 13.8 11.6 - 15.2 seconds   INR 1.05 0.00 - 1.49    Estimated body mass index is 28.37 kg/(m^2) as calculated from the following:   Height as of this encounter: 6' 1"  (1.854 m).   Weight as of this encounter: 215 lb (97.523 kg).   Imaging Review Plain radiographs demonstrate severe degenerative joint disease of the left knee(s). The overall alignment ismild varus. The bone quality appears to be good for age and reported activity level.  Assessment/Plan:  End stage arthritis, left knee   The patient history, physical examination, clinical judgment of the provider and imaging studies are consistent with end stage degenerative joint disease of the left knee(s) and total knee arthroplasty is deemed medically necessary. The treatment options including medical management, injection therapy arthroscopy and arthroplasty were discussed at length. The risks and benefits of total knee arthroplasty were presented and reviewed. The risks due to aseptic loosening, infection, stiffness, patella tracking problems, thromboembolic complications and other imponderables were discussed. The patient acknowledged the explanation, agreed to proceed with the plan and consent was signed. Patient is being admitted for inpatient treatment for surgery, pain control, PT, OT, prophylactic antibiotics, VTE prophylaxis, progressive ambulation and ADL's  and discharge planning. The patient is planning to be discharged home with home health services

## 2014-09-13 NOTE — Transfer of Care (Signed)
Immediate Anesthesia Transfer of Care Note  Patient: Jesse Quinn  Procedure(s) Performed: Procedure(s): TOTAL KNEE ARTHROPLASTY (Left)  Patient Location: PACU  Anesthesia T Spinal  Level of Consciousness: awake, oriented and patient cooperative  Airway & Oxygen Therapy: Patient Spontanous Breathing and Patient connected to nasal cannula oxygen  Post-op Assessment: Report given to PACU RN, Post -op Vital signs reviewed and stable and Patient moving all extremities  Post vital signs: Reviewed and stable  Complications: No apparent anesthesia complications

## 2014-09-14 LAB — CBC
HCT: 37.4 % — ABNORMAL LOW (ref 39.0–52.0)
HEMOGLOBIN: 12 g/dL — AB (ref 13.0–17.0)
MCH: 31.3 pg (ref 26.0–34.0)
MCHC: 32.1 g/dL (ref 30.0–36.0)
MCV: 97.4 fL (ref 78.0–100.0)
Platelets: 126 10*3/uL — ABNORMAL LOW (ref 150–400)
RBC: 3.84 MIL/uL — ABNORMAL LOW (ref 4.22–5.81)
RDW: 13.6 % (ref 11.5–15.5)
WBC: 8 10*3/uL (ref 4.0–10.5)

## 2014-09-14 LAB — PROTIME-INR
INR: 1.18 (ref 0.00–1.49)
Prothrombin Time: 15.1 seconds (ref 11.6–15.2)

## 2014-09-14 LAB — BASIC METABOLIC PANEL
Anion gap: 4 — ABNORMAL LOW (ref 5–15)
BUN: 17 mg/dL (ref 6–23)
CALCIUM: 8.1 mg/dL — AB (ref 8.4–10.5)
CO2: 28 mmol/L (ref 19–32)
Chloride: 102 mEq/L (ref 96–112)
Creatinine, Ser: 1.33 mg/dL (ref 0.50–1.35)
GFR calc Af Amer: 59 mL/min — ABNORMAL LOW (ref >90)
GFR calc non Af Amer: 51 mL/min — ABNORMAL LOW (ref >90)
GLUCOSE: 122 mg/dL — AB (ref 70–99)
POTASSIUM: 5 mmol/L (ref 3.5–5.1)
Sodium: 134 mmol/L — ABNORMAL LOW (ref 135–145)

## 2014-09-14 MED ORDER — BETHANECHOL CHLORIDE 25 MG PO TABS
25.0000 mg | ORAL_TABLET | Freq: Three times a day (TID) | ORAL | Status: DC
  Administered 2014-09-14 – 2014-09-16 (×6): 25 mg via ORAL
  Filled 2014-09-14 (×9): qty 1

## 2014-09-14 NOTE — Progress Notes (Signed)
Physical Therapy Treatment Patient Details Name: Hassell DoneRichard Duran MRN: [id number] DOB: 10-Feb-1941 Today's Date: 09/14/2014    History of Present Illness Patient is a 74 y/o male s/p L TKA.    PT Comments    Patient progressing well with mobility and should be safe for d/c tomorrow from PT standpoint.    Follow Up Recommendations  Home health PT;Supervision for mobility/OOB     Equipment Recommendations  Rolling walker with 5" wheels    Recommendations for Other Services       Precautions / Restrictions Precautions Precautions: Knee Precaution Comments: did not use knee immobilizer this session Restrictions LLE Weight Bearing: Weight bearing as tolerated    Mobility  Bed Mobility Overal bed mobility: Modified Independent                Transfers Overall transfer level: Needs assistance Equipment used: Rolling walker (2 wheeled) Transfers: Sit to/from Stand Sit to Stand: Min assist         General transfer comment: cueing for sequencing and hand placement  Ambulation/Gait Ambulation/Gait assistance: Min guard Ambulation Distance (Feet): 100 Feet Assistive device: Rolling walker (2 wheeled) Gait Pattern/deviations: Step-to pattern Gait velocity: decreased   General Gait Details: good pace and sequencing, slightly unsteady at beginning and reported some light headedness   Stairs Stairs: Yes Stairs assistance: Min assist Stair Management: No rails;Backwards;With walker      Wheelchair Mobility    Modified Rankin (Stroke Patients Only)       Balance Overall balance assessment: No apparent balance deficits (not formally assessed)                                  Cognition Arousal/Alertness: Awake/alert Behavior During Therapy: WFL for tasks assessed/performed Overall Cognitive Status: Within Functional Limits for tasks assessed                      Exercises      General Comments        Pertinent Vitals/Pain  Pain Assessment: No/denies pain    Home Living                      Prior Function            PT Goals (current goals can now be found in the care plan section) Progress towards PT goals: Progressing toward goals    Frequency  7X/week    PT Plan Current plan remains appropriate    Co-evaluation             End of Session Equipment Utilized During Treatment: Gait belt Activity Tolerance: Patient tolerated treatment well Patient left: in chair;with call bell/phone within reach;with family/visitor present     Time: 1203-1215 PT Time Calculation (min) (ACUTE ONLY): 12 min  Charges:  $Gait Training: 8-22 mins                    G Codes:      Olivia CanterMoton, Jader Desai M 09/14/2014, 1:36 PM  09/14/2014 Corlis HoveMargie Dalyn Becker, PT [phone]

## 2014-09-14 NOTE — Progress Notes (Signed)
Orthopedic Tech Progress Note Patient Details:  Hassell DoneRichard Andrades [birth year] [id number] On cpm at 8:45 pm Patient ID: Hassell DoneRichard Martian, male   DOB: [birth year], 74 y.o.   MRN: [id number]   Jennye MoccasinHughes, Sharmel Ballantine Craig 09/14/2014, 7:46 PM

## 2014-09-14 NOTE — Progress Notes (Signed)
Physical Therapy Treatment Patient Details Name: Jesse Quinn MRN: [id number] DOB: 1941-04-28 Today's Date: 09/14/2014    History of Present Illness Patient is a 74 y/o male s/p L TKA.    PT Comments    Patient progressing nicely with mobility and ROM.  Feel patient will be ready for d/c on Sun from PT standpoint.  Will benefit from continued PT to progress mobility and ROM.  Will need to review stairs before d/c.  Follow Up Recommendations  Home health PT;Supervision for mobility/OOB     Equipment Recommendations  Rolling walker with 5" wheels    Recommendations for Other Services       Precautions / Restrictions Precautions Precautions: Knee Precaution Comments: did not use knee immobilizer this session Required Braces or Orthoses: Knee Immobilizer - Left Knee Immobilizer - Left: On when out of bed or walking Restrictions LLE Weight Bearing: Weight bearing as tolerated    Mobility  Bed Mobility Overal bed mobility: Modified Independent                Transfers Overall transfer level: Needs assistance Equipment used: Rolling walker (2 wheeled) Transfers: Sit to/from Stand Sit to Stand: Supervision Stand pivot transfers: Supervision       Ambulation/Gait Ambulation/Gait assistance: Supervision Ambulation Distance (Feet): 120 Feet Assistive device: Rolling walker (2 wheeled) Gait Pattern/deviations: Step-through pattern Gait velocity: more steady pace this pm Gait velocity interpretation: Below normal speed for age/gender General Gait Details: still with some light headedness during gait.         Wheelchair Mobility    Modified Rankin (Stroke Patients Only)       Balance Overall balance assessment: No apparent balance deficits (not formally assessed)                                  Cognition Arousal/Alertness: Awake/alert Behavior During Therapy: WFL for tasks assessed/performed Overall Cognitive Status: Within  Functional Limits for tasks assessed                      Exercises Total Joint Exercises Ankle Circles/Pumps: AROM;Both;10 reps;Supine;Strengthening Quad Sets: AROM;Both;10 reps;Supine;Strengthening Gluteal Sets: AROM;Strengthening;Both;10 reps;Supine Heel Slides: Left;10 reps;Supine;AAROM Hip ABduction/ADduction: Strengthening;Left;10 reps;Supine;AAROM Straight Leg Raises: Strengthening;Left;10 reps;AAROM;Supine Long Arc Quad: AAROM;Strengthening;Left;10 reps;Seated Knee Flexion: AAROM;Left;10 reps;Seated Goniometric ROM: flexion = 70, limited by bandage    General Comments        Pertinent Vitals/Pain Pain Assessment: 0-10 Pain Score: 3  Pain Location: knee - during exercises Pain Descriptors / Indicators: Aching Pain Intervention(s): Limited activity within patient's tolerance;Monitored during session    Home Living                      Prior Function            PT Goals (current goals can now be found in the care plan section) Progress towards PT goals: Progressing toward goals    Frequency  7X/week    PT Plan Current plan remains appropriate    Co-evaluation             End of Session Equipment Utilized During Treatment: Gait belt Activity Tolerance: Patient tolerated treatment well Patient left: in bed;in CPM;with call bell/phone within reach;with family/visitor present     Time: 1400-1434 PT Time Calculation (min) (ACUTE ONLY): 34 min  Charges:  $Gait Training: 8-22 mins $Therapeutic Exercise: 8-22 mins  G Codes:      Olivia CanterMoton, Apryll Hinkle M 09/14/2014, 3:25 PM 09/14/2014 Corlis HoveMargie Derrick Orris, PT [phone]

## 2014-09-14 NOTE — Progress Notes (Addendum)
Subjective: 1 Day Post-Op Procedure(s) (LRB): TOTAL KNEE ARTHROPLASTY (Left) Patient reports pain as moderate.  Some difficulty voiding. Had to be in and out cathed.  Objective: Vital signs in last 24 hours: Temp:  [97.6 F (36.4 C)-98.6 F (37 C)] 98.6 F (37 C) (01/09 0530) Pulse Rate:  [40-78] 69 (01/09 0530) Resp:  [16-17] 16 (01/09 0530) BP: (100-111)/(58-75) 100/68 mmHg (01/09 0530) SpO2:  [97 %-100 %] 99 % (01/09 0530)  Intake/Output from previous day: 01/08 0701 - 01/09 0700 In: 2660 [P.O.:960; I.V.:1700] Out: 1210 [Urine:700; Drains:310; Blood:200] Intake/Output this shift:     Recent Labs  09/14/14 0427  HGB 12.0*    Recent Labs  09/14/14 0427  WBC 8.0  RBC 3.84*  HCT 37.4*  PLT 126*    Recent Labs  09/14/14 0427  NA 134*  K 5.0  CL 102  CO2 28  BUN 17  CREATININE 1.33  GLUCOSE 122*  CALCIUM 8.1*    Recent Labs  09/13/14 0548 09/14/14 0427  INR 1.05 1.18   left knee exam:  Neurovascular intact Sensation intact distally Intact pulses distally Dorsiflexion/Plantar flexion intact Incision: dressing C/D/I Compartment soft  Assessment/Plan: 1 Day Post-Op Procedure(s) (LRB): TOTAL KNEE ARTHROPLASTY (Left) Urinary retention. Plan: Start Urecholine 25 mg every 8 hours. Continue aspirin 325 mg twice daily for DVT prophylaxis. With SCDs. Up with therapy Plan for discharge tomorrow Hemovac drain pulled  Najia Hurlbutt G 09/14/2014, 8:48 AM

## 2014-09-14 NOTE — Op Note (Signed)
NAME:  Jesse Quinn, Jesse Quinn NO.:  [id number]  MEDICAL RECORD NO.:  [id number]  LOCATION:  [id number]                        FACILITY:  [facility]  PHYSICIAN:  Harvie Junior, M.D.   DATE OF BIRTH:  07/14/1941  DATE OF PROCEDURE:  09/13/2014 DATE OF DISCHARGE:                              OPERATIVE REPORT   PREOPERATIVE DIAGNOSIS:  End-stage degenerative joint disease, left knee.  POSTOPERATIVE DIAGNOSIS:  End-stage degenerative joint disease, left knee (with left total knee replacement with Sigma system, size 5 femur, size 5 tibia, 12.5 mm bearing, and a 38 mm all polyethylene patella).  SURGEON:  Harvie Junior, MD  ASSISTANT:  Marshia Ly, PA  ANESTHESIA:  General.  BRIEF HISTORY:  Mr. Grieco is a 74 year old male with a long history of significant complaints of left knee pain.  He was treated conservatively for prolonged period of time with activity modification, injection therapy, and physical therapy.  After failure of all these modalities and because of x-ray showing bone-on-bone change and night pain, on an ongoing basis, was taken to the operating room for left total knee replacement.  DESCRIPTION OF PROCEDURE:  The patient was taken to the operating room. After adequate anesthesia was obtained with general anesthetic, the patient was placed supine on the operating table.  Left leg was prepped and draped in usual sterile fashion.  Following this, the leg was exsanguinated.  Blood pressure tourniquet inflated to 350 mmHg. Following this, a midline incision was made and the subcutaneous tissue down the level of extensor mechanism.  Medial parapatellar arthrotomy was undertaken.  Once this was completed, medial and lateral meniscus removed.  Retropatellar fat pad, synovium in the anterior aspect of the femur and the anterior and posterior cruciate retractors were put in place.  The tibia was cut perpendicular to its long axis.  Once this was completed,  attention was turned towards the femur where an intramedullary pilot hole was drilled and the femur was cut at a 5- degree valgus inclination with 11 mm distal bone resection.  Once this was done, the femur was sized to a 5.  It was drilled.  Anterior and posterior cuts were made, chamfers and box.  Attention was then turned towards the tibia which was exposed and it was sized to a 5.  It was drilled and keeled and attention was then turned towards the placement of a 10 mm bridging bearing, but loosely went to the 12.5, went to the patella, cut down to a level of 14 mm.  A 38 paddle was chosen and lugs were drilled and the patella was put in place.  Once this was completed, the trial components were removed.  The knee was copiously and thoroughly lavaged with pulsatile lavage irrigation and the final components were then cemented into place, size 5 femur, size 5 tibia, 12.5 mm bridging bearing trial, and a 38 all poly patella.  Once this was put in place, all cement was allowed to harden and once the cement was completely hardened, the tourniquet was let down.  All bleeding was controlled with electrocautery and a final poly was placed.  A 60 mL of mixture of 40 mL Exparel and 20 mL Marcaine was  instilled in the tissues around the knee for postoperative pain control.  The medial parapatellar arthrotomy was closed with 1 Vicryl running, and once this was closed, 60 mL of 2 g of tranexamic acid and 50 mL of saline was injected into the knee and allowed to sit while the remainder of the closure was performed.  At this time, the drain was opened and all the tranexamic acid was sucked out of the knee at this point.  Following this, after closure of the knee, sterile compressive dressing was applied and the patient was taken to recovery room, was noted to be in satisfactory condition.  Estimated blood loss for procedure was minimal.     Harvie JuniorJohn L. Marylon Verno, M.D.     Ranae PlumberJLG/MEDQ  D:  09/13/2014   T:  09/14/2014  Job:  [id number]

## 2014-09-15 LAB — CBC
HCT: 34.1 % — ABNORMAL LOW (ref 39.0–52.0)
HEMOGLOBIN: 11.4 g/dL — AB (ref 13.0–17.0)
MCH: 32.9 pg (ref 26.0–34.0)
MCHC: 33.4 g/dL (ref 30.0–36.0)
MCV: 98.3 fL (ref 78.0–100.0)
PLATELETS: 110 10*3/uL — AB (ref 150–400)
RBC: 3.47 MIL/uL — ABNORMAL LOW (ref 4.22–5.81)
RDW: 13.5 % (ref 11.5–15.5)
WBC: 8.4 10*3/uL (ref 4.0–10.5)

## 2014-09-15 LAB — PROTIME-INR
INR: 1.37 (ref 0.00–1.49)
PROTHROMBIN TIME: 17 s — AB (ref 11.6–15.2)

## 2014-09-15 MED ORDER — POLYETHYLENE GLYCOL 3350 17 G PO PACK
17.0000 g | PACK | Freq: Every day | ORAL | Status: DC | PRN
  Administered 2014-09-15: 17 g via ORAL
  Filled 2014-09-15: qty 1

## 2014-09-15 MED ORDER — TAMSULOSIN HCL 0.4 MG PO CAPS
0.4000 mg | ORAL_CAPSULE | Freq: Every day | ORAL | Status: DC
  Administered 2014-09-15 – 2014-09-16 (×2): 0.4 mg via ORAL
  Filled 2014-09-15 (×2): qty 1

## 2014-09-15 NOTE — Progress Notes (Signed)
Physical Therapy Treatment Patient Details Name: Jesse DoneRichard Quinn MRN: [id number] DOB: 27-Nov-1940 Today's Date: 09/15/2014    History of Present Illness Patient is a 74 y/o male s/p L TKA.    PT Comments    This afternoon's session focused on therex, with noted imporving knee control against gravity; Pt continues to be concerned with lack of BM and continued difficulty with urinating; Still, on track for dc home tomorrow from PT standpoint   Follow Up Recommendations  Home health PT;Supervision for mobility/OOB     Equipment Recommendations  Rolling walker with 5" wheels    Recommendations for Other Services       Precautions / Restrictions Precautions Precautions: Knee Precaution Comments: Pt educated to not allow any pillow or bolster under knee for healing with optimal range of motion.  Required Braces or Orthoses: Knee Immobilizer - Left Knee Immobilizer - Left: On when out of bed or walking (did not use KI and no noted buckling) Restrictions LLE Weight Bearing: Weight bearing as tolerated    Mobility  Bed Mobility                  Transfers Overall transfer level: Needs assistance Equipment used: Rolling walker (2 wheeled) Transfers: Sit to/from Stand Sit to Stand: Supervision         General transfer comment: cueing for sequencing and hand placement  Ambulation/Gait Ambulation/Gait assistance: Min guard Ambulation Distance (Feet): 50 Feet Assistive device: Rolling walker (2 wheeled) Gait Pattern/deviations: Step-to pattern;Step-through pattern;Decreased dorsiflexion - left     General Gait Details: still with some light headedness during gait; It was present, but did not worsen; cues for more upright posture; emerging step-through pattern; knee nice and stable during amb; distance limited this pm by need to use the restroom   Stairs   Stairs assistance: Min assist     General stair comments: verbal and demo cues for technique  Wheelchair  Mobility    Modified Rankin (Stroke Patients Only)       Balance                                    Cognition Arousal/Alertness: Awake/alert Behavior During Therapy: WFL for tasks assessed/performed Overall Cognitive Status: Within Functional Limits for tasks assessed                      Exercises Total Joint Exercises Quad Sets: AROM;Both;10 reps;Supine;Strengthening Short Arc Quad: AROM;Left;10 reps;Supine Heel Slides: Left;10 reps;Supine;AAROM Straight Leg Raises: Strengthening;Left;10 reps;AAROM;Supine Goniometric ROM: flexed with assist to approx 90 deg; very painful    General Comments        Pertinent Vitals/Pain Pain Assessment: Faces Faces Pain Scale: Hurts whole lot Pain Location: L knee during therex Pain Descriptors / Indicators: Grimacing Pain Intervention(s): Limited activity within patient's tolerance;Monitored during session;Repositioned;Premedicated before session    Home Living                      Prior Function            PT Goals (current goals can now be found in the care plan section) Acute Rehab PT Goals Patient Stated Goal: To return to independent PT Goal Formulation: With patient Time For Goal Achievement: 09/19/14 Potential to Achieve Goals: Good Progress towards PT goals: Progressing toward goals    Frequency  7X/week    PT Plan Current plan remains appropriate  Co-evaluation             End of Session Equipment Utilized During Treatment: Gait belt Activity Tolerance: Patient tolerated treatment well Patient left: with call bell/phone within reach (in bathroom attempting to urinate and move bowels)     Time: 1610-9604 PT Time Calculation (min) (ACUTE ONLY): 38 min  Charges:  $Gait Training: 8-22 mins $Therapeutic Exercise: 8-22 mins $Therapeutic Activity: 8-22 mins                    G Codes:      Olen Pel 09/15/2014, 5:20 PM  Van Clines, [place]  Acute  Rehabilitation Services Pager [phone] Office [phone]

## 2014-09-15 NOTE — Progress Notes (Signed)
Physical Therapy Treatment Patient Details Name: Jesse DoneRichard Quinn MRN: [id number] DOB: [birth year] Today's Date: 09/15/2014    History of Present Illness Patient is a 74 y/o male s/p L TKA.    PT Comments    Continuing progress with functional mobility; still with difficulty urinating;  Stair training complete; performed 2 different ways to give pt options;   Plan to focus more on therex next session  Follow Up Recommendations  Home health PT;Supervision for mobility/OOB     Equipment Recommendations  Rolling walker with 5" wheels    Recommendations for Other Services       Precautions / Restrictions Precautions Precautions: Knee Precaution Comments: Pt educated to not allow any pillow or bolster under knee for healing with optimal range of motion.  Required Braces or Orthoses: Knee Immobilizer - Left Knee Immobilizer - Left: On when out of bed or walking (did not use KI and no noted buckling) Restrictions LLE Weight Bearing: Weight bearing as tolerated    Mobility  Bed Mobility                  Transfers Overall transfer level: Needs assistance Equipment used: Rolling walker (2 wheeled) Transfers: Sit to/from Stand Sit to Stand: Supervision         General transfer comment: cueing for sequencing and hand placement  Ambulation/Gait Ambulation/Gait assistance: Min guard;Supervision Ambulation Distance (Feet): 120 Feet Assistive device: Rolling walker (2 wheeled) Gait Pattern/deviations: Step-to pattern;Step-through pattern;Trunk flexed     General Gait Details: still with some light headedness during gait; It was present, but did not worsen; cues for more upright posture; emerging step-through pattern; knee nice and stable during amb   Stairs Stairs: Yes Stairs assistance: Min assist Stair Management: No rails;Backwards;With walker;Sideways;One rail Right;Step to pattern Number of Stairs: 3 (x2) General stair comments: verbal and demo cues for  technique  Wheelchair Mobility    Modified Rankin (Stroke Patients Only)       Balance                                    Cognition Arousal/Alertness: Awake/alert Behavior During Therapy: WFL for tasks assessed/performed Overall Cognitive Status: Within Functional Limits for tasks assessed                      Exercises      General Comments        Pertinent Vitals/Pain Pain Assessment: 0-10 Pain Score: 4  Pain Location: knee Pain Descriptors / Indicators: Grimacing Pain Intervention(s): Monitored during session    Home Living                      Prior Function            PT Goals (current goals can now be found in the care plan section) Acute Rehab PT Goals Patient Stated Goal: To return to independent PT Goal Formulation: With patient Time For Goal Achievement: 09/19/14 Potential to Achieve Goals: Good Progress towards PT goals: Progressing toward goals    Frequency  7X/week    PT Plan Current plan remains appropriate    Co-evaluation             End of Session Equipment Utilized During Treatment: Gait belt Activity Tolerance: Patient tolerated treatment well Patient left: with call bell/phone within reach (in bathroom attempting to urinate)     Time: [id number] PT Time  Calculation (min) (ACUTE ONLY): 28 min  Charges:  $Gait Training: 23-37 mins                    G Codes:      Van Clines Hamff 09/15/2014, 11:55 AM  Van Clines, PT  Acute Rehabilitation Services Pager [phone] Office [phone]

## 2014-09-15 NOTE — Progress Notes (Signed)
Subjective: 2 Days Post-Op Procedure(s) (LRB): TOTAL KNEE ARTHROPLASTY (Left) Patient reports pain as mild.  Making good progress with physical therapy. Still having difficulty voiding despite starting Urecholine. Positive flatus. No BM yet. Did not void overnight. Recent bladder scan showed 200 mL. Was able to void a little.  Objective: Vital signs in last 24 hours: Temp:  [98.9 F (37.2 C)-99.6 F (37.6 C)] 99.1 F (37.3 C) (01/10 0733) Pulse Rate:  [63-85] 72 (01/10 0733) Resp:  [16-18] 18 (01/10 0733) BP: (112-123)/(72-84) 123/72 mmHg (01/10 0733) SpO2:  [91 %-96 %] 91 % (01/10 0733)  Intake/Output from previous day: 01/09 0701 - 01/10 0700 In: 720 [P.O.:720] Out: 825 [Urine:825] Intake/Output this shift:     Recent Labs  09/14/14 0427 09/15/14 0510  HGB 12.0* 11.4*    Recent Labs  09/14/14 0427 09/15/14 0510  WBC 8.0 8.4  RBC 3.84* 3.47*  HCT 37.4* 34.1*  PLT 126* 110*    Recent Labs  09/14/14 0427  NA 134*  K 5.0  CL 102  CO2 28  BUN 17  CREATININE 1.33  GLUCOSE 122*  CALCIUM 8.1*    Recent Labs  09/14/14 0427 09/15/14 0510  INR 1.18 1.37   left knee exam: Neurovascular intact Sensation intact distally Intact pulses distally Dorsiflexion/Plantar flexion intact Incision: dressing C/D/I Compartment soft  Assessment/Plan: 2 Days Post-Op Procedure(s) (LRB): TOTAL KNEE ARTHROPLASTY (Left) Urinary retention, persistent. Plan: I will start Flomax 0.4 mg daily. Continue enteric-coated aspirin 325 mg twice daily with SCDs for DVT prophylaxis. Up with therapy Plan for discharge tomorrow  Charlisa Cham G 09/15/2014, 11:12 AM

## 2014-09-15 NOTE — Progress Notes (Signed)
Orthopedic Tech Progress Note Patient Details:  Jesse DoneRichard Quinn 07/29/1941 [id number]  Patient ID: Jesse Doneichard Quinn, male   DOB: 07/29/1941, 74 y.o.   MRN: [id number] Placed pt's lle in cpm @ 0-60 DEGREES @1450   Terrance Lanahan 09/15/2014, 2:50 PM

## 2014-09-16 ENCOUNTER — Encounter (HOSPITAL_COMMUNITY): Payer: Self-pay | Admitting: Orthopedic Surgery

## 2014-09-16 LAB — CBC
HCT: 31.7 % — ABNORMAL LOW (ref 39.0–52.0)
Hemoglobin: 10.5 g/dL — ABNORMAL LOW (ref 13.0–17.0)
MCH: 31.5 pg (ref 26.0–34.0)
MCHC: 33.1 g/dL (ref 30.0–36.0)
MCV: 95.2 fL (ref 78.0–100.0)
Platelets: 110 10*3/uL — ABNORMAL LOW (ref 150–400)
RBC: 3.33 MIL/uL — AB (ref 4.22–5.81)
RDW: 13.3 % (ref 11.5–15.5)
WBC: 7.6 10*3/uL (ref 4.0–10.5)

## 2014-09-16 LAB — PROTIME-INR
INR: 1.39 (ref 0.00–1.49)
Prothrombin Time: 17.2 seconds — ABNORMAL HIGH (ref 11.6–15.2)

## 2014-09-16 MED ORDER — BISACODYL 10 MG RE SUPP
10.0000 mg | Freq: Every day | RECTAL | Status: DC | PRN
  Administered 2014-09-16: 10 mg via RECTAL
  Filled 2014-09-16: qty 1

## 2014-09-16 MED ORDER — MAGNESIUM HYDROXIDE 400 MG/5ML PO SUSP
30.0000 mL | Freq: Every day | ORAL | Status: DC | PRN
  Administered 2014-09-16: 30 mL via ORAL
  Filled 2014-09-16: qty 30

## 2014-09-16 MED ORDER — TAMSULOSIN HCL 0.4 MG PO CAPS: 0.4000 mg | ORAL_CAPSULE | Freq: Every day | ORAL | Status: DC

## 2014-09-16 NOTE — Progress Notes (Signed)
09/16/14 [facility] with [facility]rie [facility]Mary [facility], [facility] due to patient needing monitoring for coumadin. Jacquelynn CreeMary Alma Mohiuddin RN, BSN, [facility]     09/16/14 Spoke with patient and verified no change in d/c plan. Confirmed with Corrie DandyMary [facility] that [facility] up for [facility]. Confirmed with Kipp BroodBrent from [facility] that they are providing CPM, rolling walker and 3N1. Jacquelynn CreeMary Jeziah Kretschmer RN, BSN, [place]  09/14/2014 [facility]. [facility] will arrange DME for home. Isidoro DonningAlesia Shavis RN CCM Case Mgmt phone [phone]

## 2014-09-16 NOTE — Progress Notes (Signed)
Subjective: 3 Days Post-Op Procedure(s) (LRB): TOTAL KNEE ARTHROPLASTY (Left) Patient reports pain as mild.  Voiding well. Positive flatus. No BM yet. Good progress with physical therapy. Ready to go home.  Objective: Vital signs in last 24 hours: Temp:  [97.9 F (36.6 C)-99 F (37.2 C)] 97.9 F (36.6 C) (01/11 0603) Pulse Rate:  [75-85] 79 (01/11 0603) Resp:  [18] 18 (01/11 0603) BP: (108-120)/(65-80) 113/65 mmHg (01/11 0603) SpO2:  [91 %-95 %] 93 % (01/11 0603)  Intake/Output from previous day: 01/10 0701 - 01/11 0700 In: 720 [P.O.:720] Out: 1775 [Urine:1775] Intake/Output this shift:     Recent Labs  09/14/14 0427 09/15/14 0510 09/16/14 0540  HGB 12.0* 11.4* 10.5*    Recent Labs  09/15/14 0510 09/16/14 0540  WBC 8.4 7.6  RBC 3.47* 3.33*  HCT 34.1* 31.7*  PLT 110* 110*    Recent Labs  09/14/14 0427  NA 134*  K 5.0  CL 102  CO2 28  BUN 17  CREATININE 1.33  GLUCOSE 122*  CALCIUM 8.1*    Recent Labs  09/15/14 0510 09/16/14 0540  INR 1.37 1.39   left knee exam:  Neurovascular intact Sensation intact distally Intact pulses distally Dorsiflexion/Plantar flexion intact Incision: dressing C/D/I Compartment soft  Assessment/Plan: 3 Days Post-Op Procedure(s) (LRB): TOTAL KNEE ARTHROPLASTY (Left) Urinary retention, resolved. Plan: Discharge home with home health Will give 10 day prescription for Flomax. He will continue his usual Coumadin dose postoperatively. Follow-up with Dr. Luiz BlareGraves in 2 weeks.  Kimbery Harwood G 09/16/2014, 8:16 AM

## 2014-09-16 NOTE — Progress Notes (Signed)
CARE MANAGEMENT NOTE 09/16/2014  Patient:  Jesse Quinn,Jesse Quinn   Account Number:  [id number]  Date Initiated:  09/14/2014  Documentation initiated by:  [facility]se  Subjective/Objective Assessment:   TOTAL KNEE ARTHROPLASTY (Left)     Action/Plan:   PT eval-recommended HHPT   Anticipated DC Date:  09/16/2014   Anticipated DC Plan:  HOME W HOME HEALTH SERVICES      DC Planning Services  CM consult      [facility]  DURABLE MEDICAL EQUIPMENT   Choice offered to / List presented to:  C-1 Patient   DME arranged  3-N-1  CPM  WALKER - ROLLING      DME agency  [employer]     HH arranged  HH-2 PT      HH agency  [facility]   Status of service:  Completed, signed off Medicare Important Message given?  YES (If response is "NO", the following Medicare IM given date fields will be blank) Date Medicare IM given:  09/16/2014 Medicare IM given by:  [employer]Warrick Llera Date Additional Medicare IM given:   Additional Medicare IM given by:    Discharge Disposition:  HOME W HOME HEALTH SERVICES  Per UR Regulation:  Reviewed for med. necessity/level of care/duration of stay  If discussed at Long Length of Stay Meetings, dates discussed:    Comments:  09/16/14 Spoke with patient and verified no change in d/c plan. Confirmed with Jesse DandyMary from [facility] that [facility] set up for HHPT. Confirmed with Jesse BroodBrent from [employer] that they are providing CPM, rolling walker and 3N1. Jesse CreeMary Zaiyden Strozier RN, BSN, [place]  09/14/2014 [facility] will arrange D[facility] for home. Jesse DonningAlesia Shavis RN CCM Case Mgmt phone [phone]

## 2014-09-16 NOTE — Progress Notes (Signed)
Physical Therapy Treatment Patient Details Name: Jesse DoneRichard Quinn MRN: [id number] DOB: 1941-03-24 Today's Date: 09/16/2014    History of Present Illness Patient is a 74 y/o male s/p L TKA.    PT Comments    Feeling better today, and able to tolerate much more amb disatnce; Anticipate good progress; OK for dc home from PT standpoint   Follow Up Recommendations  Home health PT;Supervision for mobility/OOB     Equipment Recommendations  Rolling walker with 5" wheels    Recommendations for Other Services       Precautions / Restrictions Precautions Precautions: Knee Precaution Comments: Pt educated to not allow any pillow or bolster under knee for healing with optimal range of motion.  Required Braces or Orthoses: Knee Immobilizer - Left Knee Immobilizer - Left: On when out of bed or walking (did not use KI and no noted buckling) Restrictions LLE Weight Bearing: Weight bearing as tolerated    Mobility  Bed Mobility Overal bed mobility: Modified Independent                Transfers Overall transfer level: Needs assistance Equipment used: Rolling walker (2 wheeled) Transfers: Sit to/from Stand Sit to Stand: Supervision         General transfer comment: cueing for sequencing and hand placement  Ambulation/Gait Ambulation/Gait assistance: Min guard;Supervision Ambulation Distance (Feet): 200 Feet Assistive device: Rolling walker (2 wheeled) Gait Pattern/deviations: Step-through pattern     General Gait Details: Cues to incr heel strike LLE and to step-through for more efficient gait; Nice, stable knee in stance   Stairs   Stairs assistance: Min assist     General stair comments: verbal and demo cues for technique  Wheelchair Mobility    Modified Rankin (Stroke Patients Only)       Balance                                    Cognition Arousal/Alertness: Awake/alert Behavior During Therapy: WFL for tasks  assessed/performed Overall Cognitive Status: Within Functional Limits for tasks assessed                      Exercises Total Joint Exercises Quad Sets: AROM;Both;10 reps;Supine;Strengthening Long Arc Quad: AAROM;Strengthening;Left;10 reps;Seated Knee Flexion: AAROM;Left;10 reps;Seated    General Comments        Pertinent Vitals/Pain Pain Assessment: 0-10 Pain Score: 5  Pain Location: L knee Pain Descriptors / Indicators: Aching Pain Intervention(s): Monitored during session;Repositioned    Home Living                      Prior Function            PT Goals (current goals can now be found in the care plan section) Acute Rehab PT Goals Patient Stated Goal: To return to independent PT Goal Formulation: With patient Time For Goal Achievement: 09/19/14 Potential to Achieve Goals: Good Progress towards PT goals: Progressing toward goals    Frequency  7X/week    PT Plan Current plan remains appropriate    Co-evaluation             End of Session Equipment Utilized During Treatment: Gait belt Activity Tolerance: Patient tolerated treatment well Patient left: with call bell/phone within reach (in bathroom attempting to urinate and move bowels)     Time: 4098-11911020-1054 PT Time Calculation (min) (ACUTE ONLY): 34 min  Charges:  $Gait  Training: 8-22 mins $Therapeutic Exercise: 8-22 mins                    G Codes:      Van Clines Hamff 09/16/2014, 1:20 PM  Van Clines,   Acute Rehabilitation Services Pager [phone] Office [phone]

## 2014-09-27 NOTE — Discharge Summary (Signed)
Patient ID: Jesse Quinn MRN: [id number] DOB/AGE: [birth year] 74 y.o.  Admit date: 09/13/2014 Discharge date:09/16/2014 Admission Diagnoses:  Principal Problem:   Primary osteoarthritis of left knee   Discharge Diagnoses:  Same  Past Medical History  Diagnosis Date  . Dysrhythmia     AF     . Arthritis     Surgeries: Procedure(s): Left TOTAL KNEE ARTHROPLASTY on 09/13/2014   Consultants:    Discharged Condition: Improved  Hospital Course: Jesse Quinn is an 74 y.o. male who was admitted 09/13/2014 for operative treatment ofPrimary osteoarthritis of left knee. Patient has severe unremitting pain that affects sleep, daily activities, and work/hobbies. After pre-op clearance the patient was taken to the operating room on 09/13/2014 and underwent  Procedure(s): Left TOTAL KNEE ARTHROPLASTY.    Patient was given perioperative antibiotics:  Anti-infectives    Start     Dose/Rate Route Frequency Ordered Stop   09/13/14 1400  ceFAZolin (ANCEF) IVPB 2 g/50 mL premix     2 g100 mL/hr over 30 Minutes Intravenous Every 6 hours 09/13/14 1058 09/13/14 2047   09/13/14 0600  ceFAZolin (ANCEF) IVPB 2 g/50 mL premix     2 g100 mL/hr over 30 Minutes Intravenous On call to O.R. 09/12/14 1458 09/13/14 0734       Patient was given sequential compression devices, early ambulation, and chemoprophylaxis to prevent DVT.  Patient benefited maximally from hospital stay and there were no complications.    Recent vital signs: See hospital chart for details.   Recent laboratory studies:  Recent Results (from the past 2160 hour(s))  Type and screen     Status: None   Collection Time: 09/03/14  8:35 AM  Result Value Ref Range   ABO/RH(D) AB POS    Antibody Screen NEG    Sample Expiration 09/17/2014   ABO/Rh     Status: None   Collection Time: 09/03/14  8:35 AM  Result Value Ref Range   ABO/RH(D) AB POS   Surgical pcr screen     Status: None   Collection Time: 09/03/14  8:40 AM  Result Value  Ref Range   MRSA, PCR NEGATIVE NEGATIVE   Staphylococcus aureus NEGATIVE NEGATIVE    Comment:        The Xpert SA Assay (FDA approved for NASAL specimens in patients over 54 years of age), is one component of a comprehensive surveillance program.  Test performance has been validated by [employer] for patients greater than or equal to 4 year old. It is not intended to diagnose infection nor to guide or monitor treatment.   APTT     Status: None   Collection Time: 09/03/14  8:40 AM  Result Value Ref Range   aPTT 35 24 - 37 seconds  CBC WITH DIFFERENTIAL     Status: Abnormal   Collection Time: 09/03/14  8:40 AM  Result Value Ref Range   WBC 6.0 4.0 - 10.5 K/uL   RBC 4.68 4.22 - 5.81 MIL/uL   Hemoglobin 15.5 13.0 - 17.0 g/dL   HCT 44.9 39.0 - 52.0 %   MCV 95.9 78.0 - 100.0 fL   MCH 33.1 26.0 - 34.0 pg   MCHC 34.5 30.0 - 36.0 g/dL   RDW 13.4 11.5 - 15.5 %   Platelets 134 (L) 150 - 400 K/uL   Neutrophils Relative % 60 43 - 77 %   Neutro Abs 3.5 1.7 - 7.7 K/uL   Lymphocytes Relative 30 12 - 46 %   Lymphs Abs 1.8  0.7 - 4.0 K/uL   Monocytes Relative 7 3 - 12 %   Monocytes Absolute 0.4 0.1 - 1.0 K/uL   Eosinophils Relative 3 0 - 5 %   Eosinophils Absolute 0.2 0.0 - 0.7 K/uL   Basophils Relative 0 0 - 1 %   Basophils Absolute 0.0 0.0 - 0.1 K/uL  Comprehensive metabolic panel     Status: Abnormal   Collection Time: 09/03/14  8:40 AM  Result Value Ref Range   Sodium 139 135 - 145 mmol/L    Comment: Please note change in reference range.   Potassium 4.4 3.5 - 5.1 mmol/L    Comment: Please note change in reference range.   Chloride 106 96 - 112 mEq/L   CO2 27 19 - 32 mmol/L   Glucose, Bld 103 (H) 70 - 99 mg/dL   BUN 15 6 - 23 mg/dL   Creatinine, Ser 1.19 0.50 - 1.35 mg/dL   Calcium 9.0 8.4 - 10.5 mg/dL   Total Protein 6.7 6.0 - 8.3 g/dL   Albumin 3.8 3.5 - 5.2 g/dL   AST 25 0 - 37 U/L   ALT 18 0 - 53 U/L   Alkaline Phosphatase 56 39 - 117 U/L   Total Bilirubin 0.8 0.3  - 1.2 mg/dL   GFR calc non Af Amer 58 (L) >90 mL/min   GFR calc Af Amer 68 (L) >90 mL/min    Comment: (NOTE) The eGFR has been calculated using the CKD EPI equation. This calculation has not been validated in all clinical situations. eGFR's persistently <90 mL/min signify possible Chronic Kidney Disease.    Anion gap 6 5 - 15  Protime-INR     Status: Abnormal   Collection Time: 09/03/14  8:40 AM  Result Value Ref Range   Prothrombin Time 23.1 (H) 11.6 - 15.2 seconds   INR 2.02 (H) 0.00 - 1.49  Urinalysis, Routine w reflex microscopic     Status: Abnormal   Collection Time: 09/03/14  8:40 AM  Result Value Ref Range   Color, Urine YELLOW YELLOW   APPearance CLEAR CLEAR   Specific Gravity, Urine 1.020 1.005 - 1.030   pH 5.5 5.0 - 8.0   Glucose, UA NEGATIVE NEGATIVE mg/dL   Hgb urine dipstick NEGATIVE NEGATIVE   Bilirubin Urine NEGATIVE NEGATIVE   Ketones, ur NEGATIVE NEGATIVE mg/dL   Protein, ur 30 (A) NEGATIVE mg/dL   Urobilinogen, UA 0.2 0.0 - 1.0 mg/dL   Nitrite NEGATIVE NEGATIVE   Leukocytes, UA NEGATIVE NEGATIVE  Urine microscopic-add on     Status: None   Collection Time: 09/03/14  8:40 AM  Result Value Ref Range   Squamous Epithelial / LPF RARE RARE   WBC, UA 0-2 <3 WBC/hpf   RBC / HPF 0-2 <3 RBC/hpf   Bacteria, UA RARE RARE  Protime-INR     Status: None   Collection Time: 09/13/14  5:48 AM  Result Value Ref Range   Prothrombin Time 13.8 11.6 - 15.2 seconds   INR 1.05 0.00 - 1.49  CBC     Status: Abnormal   Collection Time: 09/14/14  4:27 AM  Result Value Ref Range   WBC 8.0 4.0 - 10.5 K/uL   RBC 3.84 (L) 4.22 - 5.81 MIL/uL   Hemoglobin 12.0 (L) 13.0 - 17.0 g/dL   HCT 37.4 (L) 39.0 - 52.0 %   MCV 97.4 78.0 - 100.0 fL   MCH 31.3 26.0 - 34.0 pg   MCHC 32.1 30.0 - 36.0 g/dL  RDW 13.6 11.5 - 15.5 %   Platelets 126 (L) 150 - 400 K/uL  Basic metabolic panel     Status: Abnormal   Collection Time: 09/14/14  4:27 AM  Result Value Ref Range   Sodium 134 (L) 135 -  145 mmol/L    Comment: Please note change in reference range.   Potassium 5.0 3.5 - 5.1 mmol/L    Comment: Please note change in reference range.   Chloride 102 96 - 112 mEq/L   CO2 28 19 - 32 mmol/L   Glucose, Bld 122 (H) 70 - 99 mg/dL   BUN 17 6 - 23 mg/dL   Creatinine, Ser 1.33 0.50 - 1.35 mg/dL   Calcium 8.1 (L) 8.4 - 10.5 mg/dL   GFR calc non Af Amer 51 (L) >90 mL/min   GFR calc Af Amer 59 (L) >90 mL/min    Comment: (NOTE) The eGFR has been calculated using the CKD EPI equation. This calculation has not been validated in all clinical situations. eGFR's persistently <90 mL/min signify possible Chronic Kidney Disease.    Anion gap 4 (L) 5 - 15  Protime-INR     Status: None   Collection Time: 09/14/14  4:27 AM  Result Value Ref Range   Prothrombin Time 15.1 11.6 - 15.2 seconds   INR 1.18 0.00 - 1.49  CBC     Status: Abnormal   Collection Time: 09/15/14  5:10 AM  Result Value Ref Range   WBC 8.4 4.0 - 10.5 K/uL   RBC 3.47 (L) 4.22 - 5.81 MIL/uL   Hemoglobin 11.4 (L) 13.0 - 17.0 g/dL   HCT 34.1 (L) 39.0 - 52.0 %   MCV 98.3 78.0 - 100.0 fL   MCH 32.9 26.0 - 34.0 pg   MCHC 33.4 30.0 - 36.0 g/dL   RDW 13.5 11.5 - 15.5 %   Platelets 110 (L) 150 - 400 K/uL    Comment: PLATELET COUNT CONFIRMED BY SMEAR REPEATED TO VERIFY   Protime-INR     Status: Abnormal   Collection Time: 09/15/14  5:10 AM  Result Value Ref Range   Prothrombin Time 17.0 (H) 11.6 - 15.2 seconds   INR 1.37 0.00 - 1.49  CBC     Status: Abnormal   Collection Time: 09/16/14  5:40 AM  Result Value Ref Range   WBC 7.6 4.0 - 10.5 K/uL   RBC 3.33 (L) 4.22 - 5.81 MIL/uL   Hemoglobin 10.5 (L) 13.0 - 17.0 g/dL   HCT 31.7 (L) 39.0 - 52.0 %   MCV 95.2 78.0 - 100.0 fL   MCH 31.5 26.0 - 34.0 pg   MCHC 33.1 30.0 - 36.0 g/dL   RDW 13.3 11.5 - 15.5 %   Platelets 110 (L) 150 - 400 K/uL    Comment: CONSISTENT WITH PREVIOUS RESULT  Protime-INR     Status: Abnormal   Collection Time: 09/16/14  5:40 AM  Result Value  Ref Range   Prothrombin Time 17.2 (H) 11.6 - 15.2 seconds   INR 1.39 0.00 - 1.49     Discharge Medications:     Medication List    TAKE these medications        methocarbamol 500 MG tablet  Commonly known as:  ROBAXIN  Take 1 tablet (500 mg total) by mouth every 8 (eight) hours as needed for muscle spasms.     oxyCODONE-acetaminophen 5-325 MG per tablet  Commonly known as:  PERCOCET/ROXICET  Take 1-2 tablets by mouth every 6 (six) hours  as needed for severe pain.     tamsulosin 0.4 MG Caps capsule  Commonly known as:  FLOMAX  Take 1 capsule (0.4 mg total) by mouth daily.     warfarin 5 MG tablet  Commonly known as:  COUMADIN  Take 2.5-5 mg by mouth daily. 58m daily on Monday Wednesday and Friday and 2.571mdaily all other days.        Diagnostic Studies: Dg Chest 2 View  09/03/2014   CLINICAL DATA:  Preoperative examination prior to total knee arthroplasties; history of dysrhythmia and tobacco use  EXAM: CHEST  2 VIEW  COMPARISON:  None.  FINDINGS: The lungs are mildly hyperinflated. There is no focal infiltrate. The cardiac silhouette is mildly enlarged. The pulmonary vascularity is prominent centrally. There is no pulmonary interstitial edema. There is tortuosity of the descending thoracic aorta. There is no pleural effusion or pneumothorax. The bony thorax exhibits no acute abnormality. There is mild degenerative disc space narrowing at multiple levels.  IMPRESSION: COPD. There is mild enlargement of the cardiac silhouette with prominence of the central pulmonary vascularity which may reflect low-grade CHF. There is no evidence of pneumonia.   Electronically Signed   By: David  JoMartinique On: 09/03/2014 08:47    Disposition: 01-Home or Self Care      Discharge Instructions    CPM    Complete by:  As directed   Continuous passive motion machine (CPM):      Use the CPM from 0 to 60 for 8 hours per day.      You may increase by 5-10 per day.  You may break it up into 2 or  3 sessions per day.      Use CPM for 1-2 weeks or until you are told to stop.     Call MD / Call 911    Complete by:  As directed   If you experience chest pain or shortness of breath, CALL 911 and be transported to the hospital emergency room.  If you develope a fever above 101 F, pus (white drainage) or increased drainage or redness at the wound, or calf pain, call your surgeon's office.     Constipation Prevention    Complete by:  As directed   Drink plenty of fluids.  Prune juice may be helpful.  You may use a stool softener, such as Colace (over the counter) 100 mg twice a day.  Use MiraLax (over the counter) for constipation as needed.     Diet general    Complete by:  As directed      Do not put a pillow under the knee. Place it under the heel.    Complete by:  As directed      Increase activity slowly as tolerated    Complete by:  As directed      Weight bearing as tolerated    Complete by:  As directed   Laterality:  left  Extremity:  Lower     Weight bearing as tolerated    Complete by:  As directed   Laterality:  left  Extremity:  Lower           Follow-up Information    Follow up with GRAVES,JOHN L, MD. Schedule an appointment as soon as possible for a visit in 2 weeks.   Specialty:  Orthopedic Surgery   Contact information:   [address] [phone]     [facility]  Why:  [facility] Physical Therapy   Contact information:   [address] [place] [address] [phone]        Signed: Erlene Senters 09/27/2014, 12:53 PM

## 2015-07-28 ENCOUNTER — Observation Stay (HOSPITAL_COMMUNITY)
Admission: EM | Admit: 2015-07-28 | Discharge: 2015-07-29 | Disposition: A | Discharge status: Home / Self Care | Payer: Medicare Other | Attending: Interventional Cardiology | Admitting: Interventional Cardiology

## 2015-07-28 ENCOUNTER — Emergency Department (HOSPITAL_COMMUNITY): Payer: Medicare Other

## 2015-07-28 ENCOUNTER — Encounter (HOSPITAL_COMMUNITY): Payer: Self-pay | Admitting: *Deleted

## 2015-07-28 DIAGNOSIS — I482 Chronic atrial fibrillation, unspecified: Secondary | ICD-10-CM | POA: Diagnosis present

## 2015-07-28 DIAGNOSIS — G441 Vascular headache, not elsewhere classified: Secondary | ICD-10-CM

## 2015-07-28 DIAGNOSIS — H538 Other visual disturbances: Principal | ICD-10-CM | POA: Insufficient documentation

## 2015-07-28 DIAGNOSIS — I499 Cardiac arrhythmia, unspecified: Secondary | ICD-10-CM | POA: Insufficient documentation

## 2015-07-28 DIAGNOSIS — G459 Transient cerebral ischemic attack, unspecified: Secondary | ICD-10-CM | POA: Diagnosis not present

## 2015-07-28 DIAGNOSIS — Z79899 Other long term (current) drug therapy: Secondary | ICD-10-CM | POA: Diagnosis not present

## 2015-07-28 DIAGNOSIS — M1990 Unspecified osteoarthritis, unspecified site: Secondary | ICD-10-CM | POA: Insufficient documentation

## 2015-07-28 DIAGNOSIS — R51 Headache: Secondary | ICD-10-CM | POA: Diagnosis not present

## 2015-07-28 DIAGNOSIS — Z7901 Long term (current) use of anticoagulants: Secondary | ICD-10-CM | POA: Diagnosis not present

## 2015-07-28 DIAGNOSIS — F17210 Nicotine dependence, cigarettes, uncomplicated: Secondary | ICD-10-CM | POA: Diagnosis not present

## 2015-07-28 DIAGNOSIS — H547 Unspecified visual loss: Secondary | ICD-10-CM | POA: Diagnosis not present

## 2015-07-28 DIAGNOSIS — R519 Headache, unspecified: Secondary | ICD-10-CM

## 2015-07-28 LAB — I-STAT CHEM 8, ED
BUN: 22 mg/dL — ABNORMAL HIGH (ref 6–20)
CALCIUM ION: 1.17 mmol/L (ref 1.13–1.30)
CHLORIDE: 101 mmol/L (ref 101–111)
Creatinine, Ser: 1.2 mg/dL (ref 0.61–1.24)
Glucose, Bld: 87 mg/dL (ref 65–99)
HEMATOCRIT: 50 % (ref 39.0–52.0)
Hemoglobin: 17 g/dL (ref 13.0–17.0)
POTASSIUM: 4.4 mmol/L (ref 3.5–5.1)
SODIUM: 139 mmol/L (ref 135–145)
TCO2: 26 mmol/L (ref 0–100)

## 2015-07-28 LAB — CBC
HCT: 44.6 % (ref 39.0–52.0)
Hemoglobin: 14.9 g/dL (ref 13.0–17.0)
MCH: 32 pg (ref 26.0–34.0)
MCHC: 33.4 g/dL (ref 30.0–36.0)
MCV: 95.9 fL (ref 78.0–100.0)
PLATELETS: 139 10*3/uL — AB (ref 150–400)
RBC: 4.65 MIL/uL (ref 4.22–5.81)
RDW: 13.5 % (ref 11.5–15.5)
WBC: 6.6 10*3/uL (ref 4.0–10.5)

## 2015-07-28 LAB — COMPREHENSIVE METABOLIC PANEL
ALBUMIN: 3.7 g/dL (ref 3.5–5.0)
ALT: 21 U/L (ref 17–63)
ANION GAP: 8 (ref 5–15)
AST: 31 U/L (ref 15–41)
Alkaline Phosphatase: 75 U/L (ref 38–126)
BUN: 16 mg/dL (ref 6–20)
CHLORIDE: 104 mmol/L (ref 101–111)
CO2: 26 mmol/L (ref 22–32)
Calcium: 9.4 mg/dL (ref 8.9–10.3)
Creatinine, Ser: 1.22 mg/dL (ref 0.61–1.24)
GFR calc Af Amer: >60 mL/min (ref >60)
GFR calc non Af Amer: 57 mL/min — ABNORMAL LOW (ref >60)
GLUCOSE: 93 mg/dL (ref 65–99)
POTASSIUM: 4.4 mmol/L (ref 3.5–5.1)
SODIUM: 138 mmol/L (ref 135–145)
Total Bilirubin: 0.9 mg/dL (ref 0.3–1.2)
Total Protein: 6.7 g/dL (ref 6.5–8.1)

## 2015-07-28 LAB — TSH: TSH: 2.702 u[IU]/mL (ref 0.350–4.500)

## 2015-07-28 LAB — I-STAT TROPONIN, ED: Troponin i, poc: 0.03 ng/mL (ref 0.00–0.08)

## 2015-07-28 LAB — MRSA PCR SCREENING: MRSA by PCR: NEGATIVE

## 2015-07-28 LAB — PROTIME-INR
INR: 2.38 — AB (ref 0.00–1.49)
PROTHROMBIN TIME: 25.7 s — AB (ref 11.6–15.2)

## 2015-07-28 LAB — DIFFERENTIAL
BASOS PCT: 0 %
Basophils Absolute: 0 10*3/uL (ref 0.0–0.1)
EOS PCT: 2 %
Eosinophils Absolute: 0.1 10*3/uL (ref 0.0–0.7)
Lymphocytes Relative: 33 %
Lymphs Abs: 2.2 10*3/uL (ref 0.7–4.0)
MONO ABS: 0.5 10*3/uL (ref 0.1–1.0)
Monocytes Relative: 7 %
NEUTROS PCT: 58 %
Neutro Abs: 3.8 10*3/uL (ref 1.7–7.7)

## 2015-07-28 LAB — TROPONIN I: Troponin I: 0.03 ng/mL (ref <0.031)

## 2015-07-28 LAB — APTT: aPTT: 40 seconds — ABNORMAL HIGH (ref 24–37)

## 2015-07-28 MED ORDER — ACETAMINOPHEN 325 MG PO TABS: 650.0000 mg | ORAL_TABLET | ORAL | Status: DC | PRN

## 2015-07-28 MED ORDER — WARFARIN SODIUM 5 MG PO TABS
5.0000 mg | ORAL_TABLET | ORAL | Status: DC
  Administered 2015-07-28: 5 mg via ORAL
  Filled 2015-07-28: qty 1

## 2015-07-28 MED ORDER — OXYCODONE-ACETAMINOPHEN 5-325 MG PO TABS: 1.0000 | ORAL_TABLET | Freq: Four times a day (QID) | ORAL | Status: DC | PRN

## 2015-07-28 MED ORDER — WARFARIN - PHARMACIST DOSING INPATIENT: Freq: Every day | Status: DC

## 2015-07-28 MED ORDER — WARFARIN SODIUM 2.5 MG PO TABS
2.5000 mg | ORAL_TABLET | ORAL | Status: DC
Start: 2015-07-29 — End: 2015-07-29
  Administered 2015-07-29: 2.5 mg via ORAL
  Filled 2015-07-28: qty 1

## 2015-07-28 MED ORDER — ONDANSETRON HCL 4 MG/2ML IJ SOLN: 4.0000 mg | Freq: Four times a day (QID) | INTRAMUSCULAR | Status: DC | PRN
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
## 2015-07-28 NOTE — ED Notes (Signed)
Attempted report X1
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
## 2015-07-28 NOTE — ED Notes (Signed)
[place] to [place] this [place] 0700 and pt had vision loss that lasted approx 30 seconds and pt having dizziness since. Vision has improved, just reports "feeling cloudy."

## 2015-07-28 NOTE — H&P (Signed)
Patient ID: Jesse DoneRichard Fiser MRN: [id number], DOB/AGE: [birth year]   Admit date: 07/28/2015  Primary Physician: Lillia MountainGRIFFIN,JOHN JOSEPH, MD Primary Cardiologist: New (Dr. Katrinka BlazingSmith)  HPI:  Jesse Quinn is a 74 y.o. male with a history of asymptomatic chronic atrial fibrillation who [facility] for evaluation of blurred vision.  Patient states that he has a A. fib for the past 30-40 years. No history of ablation or cardioversion. [place] [facility] [place]. He takes Coumadin for anticoagulation, managed by PCP. Not on any rate control medicine. He states his rates always runs in 50s. No prior history of heart attack or echocardiogram. Never seen by cardiologist. He goes to gym 3 times in a week. Former runner. No exertional chest pain or shortness of breath. The patient denies nausea, vomiting, fever, chest pain, palpitations, shortness of breath, orthopnea, PND, dizziness, syncope, cough, congestion, abdominal pain, hematochezia, melena, lower extremity edema. Denies history of hypertension, hyperlipidemia, diabetes, stroke or TIA.  This morning patient had a sudden onset of bilateral blurry vision for approximately 5 seconds while driving. No prodrome. Currently improved, however slight blurry vision. No dizziness or syncope event. No accident. He went to gym this morning prior to event and able to complete routine without any difficulty.  In ED, point-of-care troponin negative. Lytes normal. EKG showed A. fib with slow ventricular rate of 58 bpm with T-wave inversion in inferior lateral lead which appears similar to previous EKG 07/2014 except more prominent in leads II.   Problem List  Past Medical History  Diagnosis Date  . Dysrhythmia     AF     . Arthritis     Past Surgical History  Procedure Laterality Date  . Tonsillectomy    . Appendectomy    . Total knee arthroplasty Left 09/13/2014    Procedure: TOTAL KNEE ARTHROPLASTY;   Surgeon: Harvie JuniorJohn L Graves, MD;  Location: [facility] OR;  Service: Orthopedics;  Laterality: Left;     Allergies  No Known Allergies   Home Medications  Prior to Admission medications   Medication Sig Start Date End Date Taking? Authorizing Provider  acetaminophen (TYLENOL) 500 MG tablet Take 500 mg by mouth every 6 (six) hours as needed for moderate pain.   Yes Historical Provider, MD  warfarin (COUMADIN) 5 MG tablet Take 2.5-5 mg by mouth daily at 6 PM. 5mg  daily on Monday and Friday and 2.5mg  daily all other days.   Yes Historical Provider, MD  methocarbamol (ROBAXIN) 500 MG tablet Take 1 tablet (500 mg total) by mouth every 8 (eight) hours as needed for muscle spasms. 09/13/14   Marshia LyJames Bethune, PA-C  oxyCODONE-acetaminophen (PERCOCET/ROXICET) 5-325 MG per tablet Take 1-2 tablets by mouth every 6 (six) hours as needed for severe pain. 09/13/14   Marshia LyJames Bethune, PA-C  tamsulosin (FLOMAX) 0.4 MG CAPS capsule Take 1 capsule (0.4 mg total) by mouth daily. 09/16/14   Marshia LyJames Bethune, PA-C    Family History  His dad had a heart attack at age of 75. No medical history of mom.  Social History  Social History   Social History  . Marital Status: Married    Spouse Name: N/A  . Number of Children: N/A  . Years of Education: N/A   Occupational History  . Not on file.   Social History Main Topics  . Smoking status: Current Every Day Smoker    Types: Cigars  . Smokeless tobacco: Not on file  . Alcohol Use: Yes  Comment: weekly  . Drug Use: No  . Sexual Activity: Not on file   Other Topics Concern  . Not on file   Social History Narrative     All other systems reviewed and are otherwise negative except as noted above.  Physical Exam  Blood pressure 153/95, pulse 48, temperature 98.1 F (36.7 C), temperature source Oral, resp. rate 18, height  (1.854 m), weight 224 lb 8 oz (101.833 kg), SpO2 98 %.  General: Pleasant, NAD Psych: Normal affect. Neuro: Alert and oriented X 3. Moves all  extremities spontaneously. HEENT: Normal  Neck: Supple without bruits or JVD. Lungs:  Resp regular and unlabored, CTA. Heart: ir ir with bradycardia no s3, s4, or murmurs. Abdomen: Soft, non-tender, non-distended, BS + x 4.  Extremities: No clubbing, cyanosis or edema. DP/PT/Radials 2+ and equal bilaterally.  Labs  No results for input(s): CKTOTAL, CKMB, TROPONINI in the last 72 hours. Lab Results  Component Value Date   WBC 6.6 07/28/2015   HGB 17.0 07/28/2015   HCT 50.0 07/28/2015   MCV 95.9 07/28/2015   PLT 139* 07/28/2015    Recent Labs Lab 07/28/15 1541 07/28/15 1554  NA 138 139  K 4.4 4.4  CL 104 101  CO2 26  --   BUN 16 22*  CREATININE 1.22 1.20  CALCIUM 9.4  --   PROT 6.7  --   BILITOT 0.9  --   ALKPHOS 75  --   ALT 21  --   AST 31  --   GLUCOSE 93 87     Radiology/Studies  No results found.  ECG  Vent. rate 58 BPM PR interval * ms QRS duration 88 ms QT/QTc 420/412 ms  ASSESSMENT AND PLAN  1. Chronic A. fib with slow ventricular rate - Patient states that he has a A. fib for the past 30-40 years. No history of ablation or cardioversion. Never been on any antiarrhythmic. He takes Coumadin for anticoagulation, managed by PCP. INR of 2.38 today. Not on any rate control medicine. Avoid AV blocking agent. - CHADVASCs score of 1 (age). Continue Coumadin per pharmacy. - point-of-care troponin negative. Lytes normal. EKG showed A. fib with slow ventricular rate of 58 bpm with T-wave inversion in inferior lateral lead which appears similar to previous EKG 07/2014 except more prominent in leads II.  - Will get echocardiogram to assess left ventricular function and carotid doppler.    2. Bilateral blurred vision - Almost back to baseline. Unknown etiology. Pending head CT. No slurred speech or weakness. Will get neurology consult for transient blurred visit.   SignedManson Passey, PA-C 07/28/2015, 4:58 PM Pager [phone]

## 2015-07-28 NOTE — ED Provider Notes (Signed)
Medical screening examination/treatment/procedure(s) were conducted as a shared visit with non-physician practitioner(s) and myself.  I personally evaluated the patient during the encounter.  74 yo M w/ an episode of bilateral change in vision, blurry, able to see light and shapes only. No prodrome. Some improvement now, just slightly blurry vision. Exam otherwise nonfocal. Has HR as low as 43 while i was in the room. Given he has no other medical problems and his complaints don't seem to fit a neurologic region I think it is more consistent with a near syncopal event. With his bradycardia, he may benefit from cardiology consultation and admission.    EKG Interpretation None        Marily MemosJason Dewarren Ledbetter, MD 07/29/15 1303

## 2015-07-29 ENCOUNTER — Encounter (HOSPITAL_COMMUNITY): Payer: Self-pay | Admitting: *Deleted

## 2015-07-29 ENCOUNTER — Observation Stay (HOSPITAL_BASED_OUTPATIENT_CLINIC_OR_DEPARTMENT_OTHER): Payer: Medicare Other

## 2015-07-29 ENCOUNTER — Observation Stay (HOSPITAL_COMMUNITY): Payer: Medicare Other

## 2015-07-29 DIAGNOSIS — Z7901 Long term (current) use of anticoagulants: Secondary | ICD-10-CM

## 2015-07-29 DIAGNOSIS — G441 Vascular headache, not elsewhere classified: Secondary | ICD-10-CM | POA: Diagnosis not present

## 2015-07-29 DIAGNOSIS — R51 Headache: Secondary | ICD-10-CM

## 2015-07-29 DIAGNOSIS — I482 Chronic atrial fibrillation: Secondary | ICD-10-CM | POA: Diagnosis not present

## 2015-07-29 DIAGNOSIS — H538 Other visual disturbances: Secondary | ICD-10-CM | POA: Diagnosis not present

## 2015-07-29 DIAGNOSIS — I4891 Unspecified atrial fibrillation: Secondary | ICD-10-CM

## 2015-07-29 LAB — BASIC METABOLIC PANEL
Anion gap: 7 (ref 5–15)
BUN: 15 mg/dL (ref 6–20)
CALCIUM: 9.3 mg/dL (ref 8.9–10.3)
CO2: 31 mmol/L (ref 22–32)
Chloride: 105 mmol/L (ref 101–111)
Creatinine, Ser: 1.28 mg/dL — ABNORMAL HIGH (ref 0.61–1.24)
GFR calc Af Amer: >60 mL/min (ref >60)
GFR, EST NON AFRICAN AMERICAN: 53 mL/min — AB (ref >60)
GLUCOSE: 112 mg/dL — AB (ref 65–99)
POTASSIUM: 5.1 mmol/L (ref 3.5–5.1)
Sodium: 143 mmol/L (ref 135–145)

## 2015-07-29 LAB — TROPONIN I: TROPONIN I: 0.03 ng/mL (ref <0.031)

## 2015-07-29 LAB — LIPID PANEL
CHOL/HDL RATIO: 3.6 ratio
Cholesterol: 150 mg/dL (ref 0–200)
HDL: 42 mg/dL (ref >40)
LDL Cholesterol: 97 mg/dL (ref 0–99)
Triglycerides: 54 mg/dL (ref <150)
VLDL: 11 mg/dL (ref 0–40)

## 2015-07-29 LAB — PROTIME-INR
INR: 2.5 — AB (ref 0.00–1.49)
PROTHROMBIN TIME: 26.7 s — AB (ref 11.6–15.2)

## 2015-07-29 MED ORDER — IOHEXOL 350 MG/ML SOLN
80.0000 mL | Freq: Once | INTRAVENOUS | Status: AC | PRN
  Administered 2015-07-29: 80 mL via INTRAVENOUS

## 2015-07-29 NOTE — Care Management Obs Status (Signed)
MEDICARE OBSERVATION STATUS NOTIFICATION   Patient Details  Name: Jesse Quinn MRN: [id number] Date of Birth: [birth year]   Medicare Observation Status Notification Given:  Yes    Gala LewandowskyGraves-Bigelow, Hamdi Vari Kaye, RN 07/29/2015, 4:15 PM

## 2015-07-29 NOTE — Progress Notes (Addendum)
STROKE TEAM PROGRESS NOTE   HISTORY Jesse Quinn is an 74 y.o. male with a history of atrial fibrillation and arthritis who experienced acute onset of blurred vision involving both eyes at 6:45 AM today 07/28/2015 ([facility]). He was driving at the time and was unable to see the road well. See lights but could not make out images well. He was in a very familiar area and was able to pull into his local church parking lot. Vision was significantly impaired for about 30 seconds, then improved rapidly. He still complaining of slight blurring of vision, however. MRI of his brain was recommended and is pending. He is on anticoagulation with Coumadin. INR today was 2.38. Patient has no history of diabetes mellitus and blood sugar in the ED was 87. Patient was not administered TPA secondary to  Rapid improvement in symptoms. He was admitted for further evaluation and treatment.   SUBJECTIVE (INTERVAL HISTORY) His RN is at the bedside, no family present. Patient awake and sitting up in the bed. Reported transient blurry vision in both eyes in about 2-3 min. Now fully resolved. He reports he had a "little HA" and was a little "foggy in his head" at that time. States he has had mild HA 3/10 1-2 times a week and is improved with tylenol. No typical migraine symptoms. He recounted his HPI with DR. Shaquan Missey in detail alone with current condition and treatment.   OBJECTIVE Temp:  [97.7 F (36.5 C)-98.3 F (36.8 C)] 98.3 F (36.8 C) (11/22 0831) Pulse Rate:  [31-139] 47 (11/22 0330) Cardiac Rhythm:  [-] Atrial fibrillation (11/22 0700) Resp:  [13-23] 16 (11/22 0330) BP: (98-158)/(62-101) 98/62 mmHg (11/22 0330) SpO2:  [94 %-99 %] 96 % (11/22 0330) Weight:  [97.569 kg (215 lb 1.6 oz)-101.833 kg (224 lb 8 oz)] 97.569 kg (215 lb 1.6 oz) (11/22 0400)  CBC:  Recent Labs Lab 07/28/15 1541 07/28/15 1554  WBC 6.6  --   NEUTROABS 3.8  --   HGB 14.9 17.0  HCT 44.6 50.0  MCV 95.9  --   PLT 139*  --     Basic  Metabolic Panel:  Recent Labs Lab 07/28/15 1541 07/28/15 1554 07/29/15 0352  NA 138 139 143  K 4.4 4.4 5.1  CL 104 101 105  CO2 26  --  31  GLUCOSE 93 87 112*  BUN 16 22* 15  CREATININE 1.22 1.20 1.28*  CALCIUM 9.4  --  9.3    Lipid Panel:    Component Value Date/Time   CHOL 150 07/29/2015 0352   TRIG 54 07/29/2015 0352   HDL 42 07/29/2015 0352   CHOLHDL 3.6 07/29/2015 0352   VLDL 11 07/29/2015 0352   LDLCALC 97 07/29/2015 0352   HgbA1c: No results found for: HGBA1C Urine Drug Screen: No results found for: LABOPIA, COCAINSCRNUR, LABBENZ, AMPHETMU, THCU, LABBARB    IMAGING  MRI HEAD 07/28/2015  No acute infarct. Remote inferior medial right cerebellar infarct. Global atrophy without hydrocephalus.  MRA HEAD  07/28/2015  Artifact extends through the cavernous segment of the internal carotid artery bilaterally. Internal carotid arteries are ectatic. Anterior circulation without medium or large size vessel significant stenosis or occlusion. Middle cerebral artery branch vessel narrowing and irregularity bilaterally. A2 segment anterior cerebral artery narrowing and irregularity bilaterally. Left vertebral artery is dominant. No significant stenosis of the distal vertebral arteries. Poor delineation of the mid to distal right posterior inferior cerebellar artery consistent with patient's remote infarct. Mild narrowing and irregularity of portions of  the left posterior inferior cerebellar artery. Fenestrated proximal basilar artery. Bulbous appearance of the basilar tip without saccular aneurysm. Nonvisualized anterior inferior cerebellar arteries. Mild narrowing and irregularity posterior cerebral artery branches bilaterally.   CTA  Head and neck - pending  2D echo - pending   PHYSICAL EXAM  Temp:  [97.7 F (36.5 C)-98.3 F (36.8 C)] 98.3 F (36.8 C) (11/22 0831) Pulse Rate:  [31-139] 47 (11/22 0330) Resp:  [13-23] 16 (11/22 0330) BP: (98-158)/(62-101) 98/62 mmHg (11/22  0330) SpO2:  [94 %-99 %] 96 % (11/22 0330) Weight:  [215 lb 1.6 oz (97.569 kg)-224 lb 8 oz (101.833 kg)] 215 lb 1.6 oz (97.569 kg) (11/22 0400)  General - Well nourished, well developed, in no apparent distress.  Ophthalmologic - Fundi not visualized due to small pupils.  Cardiovascular - Regular rate and rhythm.  Mental Status -  Level of arousal and orientation to time, place, and person were intact. Language including expression, naming, repetition, comprehension was assessed and found intact. Attention span and concentration were normal. Recent and remote memory were intact. Fund of Knowledge was assessed and was intact.  Cranial Nerves II - XII - II - Visual field intact OU. III, IV, VI - Extraocular movements intact. V - Facial sensation intact bilaterally. VII - Facial movement intact bilaterally. VIII - Hearing & vestibular intact bilaterally. X - Palate elevates symmetrically. XI - Chin turning & shoulder shrug intact bilaterally. XII - Tongue protrusion intact.  Motor Strength - The patient's strength was normal in all extremities and pronator drift was absent.  Bulk was normal and fasciculations were absent.   Motor Tone - Muscle tone was assessed at the neck and appendages and was normal.  Reflexes - The patient's reflexes were 1+ in all extremities and he had no pathological reflexes.  Sensory - Light touch, temperature/pinprick, vibration and proprioception, and Romberg testing were assessed and were symmetrical.    Coordination - The patient had normal movements in the hands and feet with no ataxia or dysmetria.  Tremor was absent.  Gait and Station - The patient's transfers, posture, gait, station, and turns were observed as normal.   ASSESSMENT/PLAN Mr. Jesse Quinn is a 74 y.o. male with history of atrial fibrillation on coumadin and arthritis presenting with acute onset of blurred vision. He did not receive IV t-PA due to rapid improvement in symptoms.    Bilateral transient blurry vision - etiology unclear - DDx includes migraine, cerebral hypoperfusion, seizure, b/l papilledema. 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 No stroke. Doubt TIA as homogenous blurry vision across visual fields and INR therapeutic. Seizure unlikely according to HPI as well as b/l papilledema unlikely due to rapid resolution.  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 Pharmacy dosing. INR goal 2-3  Ongoing aggressive stroke risk factor management  Therapy recommendations:  pending   Disposition:  Anticipate return home  Atrial Fibrillation  Home anticoagulation:  warfarin continued in the hospital  INR 2.38 on admission  INR 2.5 today  INR goal 2-3  Tobacco abuse  Current smoker  Smoking cessation counseling provided  Pt is willing to quit  Other Stroke Risk Factors  Advanced age  ETOH use  Other Problems  CKD stage 2-3  Hospital day # 1  Rhoderick MoodyBIBY,SHARON  [facility] See Amion for Pager information 07/29/2015 10:18 AM   I, the attending vascular neurologist, have personally obtained a history, examined the patient, evaluated laboratory data, individually viewed imaging studies and agree with radiology interpretations. I also discussed with pt regarding his care plan. Together with the NP/PA, we formulated the assessment and plan of care which reflects our mutual decision.  I have made any additions or clarifications directly to the above note and agree with the findings and plan as currently documented.   74 yo M with hx of afib on coumadin with therapeutic INR and smoker was  admitted for transient bilateral homogenous blurry vision, lasting only 2-3 minutes and resolved completely. MRI no stroke. Doubt TIA as blurry vision homogenous, no laterality preference, does not consistent with bilateral hemianopia, or cortical blindness. In addition, INR 2.38 on admission. No seizure. Not sure about BP. He does have mild HA at that time and baseline mild HA 1-2 per week. No typical migraine symptoms. Etiology still unclear, but favor complicated migraine at this time. MRA showed fenestrated BA and BA tip bulbous appearance, will do CTA head and neck to rule out BA tip thrombus. Continue coumadin and INR goal 2-3.   Marvel PlanJindong Amante Fomby, MD PhD Stroke Neurology 07/29/2015 1:10 PM   ADDENDUM: CTA head and neck not revealing. Low suspicious for stroke still, OK to discharge from stroke standpoint, will follow up with stroke neurology with me in [facility] in 2 months. Orders written.   Marvel PlanJindong Marqueze Ramcharan, MD PhD Stroke Neurology 07/29/2015 2:49 PM      To contact Stroke Continuity provider, please refer to [email or web address]. After hours, contact General Neurology

## 2015-07-29 NOTE — Progress Notes (Signed)
Pt had 5 beat run V Tach.  Pt asymptomatic.  HR 60's (Afib).  Cardiology PA [facility](Bhagat) made aware. Will check labs if another episode is seen.  Shanda BumpsJessica, RN aware.

## 2015-07-29 NOTE — Discharge Summary (Signed)
Discharge Summary   Patient ID: Jesse Quinn,  MRN: [id number], DOB/AGE: [birth year] 74 y.o.  Admit date: 07/28/2015 Discharge date: 07/29/2015  Primary Care Provider: Lillia Mountain Primary Cardiologist: New (Dr. Katrinka Blazing)  Discharge Diagnoses Active Problems:   Dim vision of both eyes   Chronic atrial fibrillation (HCC)   Chronic anticoagulation   Headache   Chronic a-fib (HCC)   Blurred vision, bilateral   Tobacco smoking   Allergies No Known Allergies  Consultant: Neurology  Procedures  Echo 07/29/15 LV EF: 60% -  65%  ------------------------------------------------------------------- Indications:   Atrial fibrillation - 427.31.  ------------------------------------------------------------------- History:  Risk factors: Current tobacco use.  ------------------------------------------------------------------- Study Conclusions  - Left ventricle: The cavity size was normal. Wall thickness was increased in a pattern of mild LVH. Systolic function was normal. The estimated ejection fraction was in the range of 60% to 65%. - Aortic valve: There was mild regurgitation. - Left atrium: The atrium was severely dilated. - Right ventricle: The cavity size was severely dilated. - Right atrium: The atrium was massively dilated. - Pulmonary arteries: Systolic pressure was moderately increased. PA peak pressure: 44 mm Hg (S).  History of Present Illness  Jesse Quinn is a 74 y.o. male with a history of asymptomatic chronic atrial fibrillation and current smoker who presented 07/28/15 to [facility] ED for evaluation of blurred vision.  Patient stated that he has a A. fib for the past 30-40 years. No history of ablation or cardioversion. He moved from [facility] to [place] about 10 years ago. He takes Coumadin for anticoagulation, managed by PCP. Not on any rate control medicine. He states his rates always runs in 50s. No prior history of  heart attack or echocardiogram. Never seen by cardiologist. He goes to gym 3 times in a week. Former runner. No exertional chest pain or shortness of breath. The patient denies nausea, vomiting, fever, chest pain, palpitations, shortness of breath, orthopnea, PND, dizziness, syncope, cough, congestion, abdominal pain, hematochezia, melena, lower extremity edema. Denies history of hypertension, hyperlipidemia, diabetes, stroke or TIA.  He experienced acute onset of blurred vision involving both eyes at 6:45 AM 11/21. He was driving at the time and was unable to see the road well. See lights but could not make out images well. He was in a very familiar area and was able to pull into his local church parking lot. Vision was significantly impaired for about 30 seconds, then improved rapidly. No prodrome. He was  still complaining of slight blurring of vision in ED No dizziness or syncope event. No accident. He went to gym this morning prior to event and able to complete routine without any difficulty.  In ED, point-of-care troponin negative. Lytes normal. EKG showed A. fib with slow ventricular rate of 58 bpm with T-wave inversion in inferior lateral lead which appears similar to previous EKG 07/2014 except more prominent in leads II.  INR of 2.38. Patient has no history of diabetes mellitus and blood sugar in the ED was 87.   Hospital Course  The patient was admitted for further evaluation. The patient was also evaluation by neurology. 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 MRAshowed  BA fenestrated proximally and bulbous appearance at the the tip. CTA head and neck not revealing. However does showed Mild atherosclerotic calcifications at the carotid bifurcations bilaterally without significant stenosis. Marked tortuosity of the cervical internal carotid arteries, right greater than left, without significant associated stenosis. TSH normal. LDL 97. Trop x 3 negative. Doubt TIA  as blurry vision  homogenous, no laterality preference, does not consistent with bilateral hemianopia, or cortical blindness. Neurology felt that he does have mild HA at that time and baseline mild HA 1-2 per week. No typical migraine symptoms. Etiology still unclear, but favor complicated migraine at this time. Echo showed LV ef of 60-65%, mild LVH, mild AR, PA peak pressure: 44 mm Hg (S), severely dilated RV and massively dilated RA. He was strongly encouraged to stop tobacco smoking.   She has been seen by Dr. Katrinka BlazingSmith and Dr. Roda ShuttersXu  today and deemed ready for discharge home. All follow-up appointments have been scheduled. Discharge medications are listed below.    Discharge Vitals Blood pressure 125/81, pulse 72, temperature 98.3 F (36.8 C), temperature source Oral, resp. rate 16, height 6\' 1"  (1.854 m), weight 215 lb 1.6 oz (97.569 kg), SpO2 98 %.  Filed Weights   07/28/15 1506 07/28/15 2132 07/29/15 0400  Weight: 224 lb 8 oz (101.833 kg) 215 lb 9.6 oz (97.796 kg) 215 lb 1.6 oz (97.569 kg)    Labs  CBC  Recent Labs  07/28/15 1541 07/28/15 1554  WBC 6.6  --   NEUTROABS 3.8  --   HGB 14.9 17.0  HCT 44.6 50.0  MCV 95.9  --   PLT 139*  --    Basic Metabolic Panel  Recent Labs  07/28/15 1541 07/28/15 1554 07/29/15 0352  NA 138 139 143  K 4.4 4.4 5.1  CL 104 101 105  CO2 26  --  31  GLUCOSE 93 87 112*  BUN 16 22* 15  CREATININE 1.22 1.20 1.28*  CALCIUM 9.4  --  9.3   Liver Function Tests  Recent Labs  07/28/15 1541  AST 31  ALT 21  ALKPHOS 75  BILITOT 0.9  PROT 6.7  ALBUMIN 3.7   Cardiac Enzymes  Recent Labs  07/28/15 2214 07/29/15 0352 07/29/15 0950  TROPONINI 0.03 0.03 <0.03   Fasting Lipid Panel  Recent Labs  07/29/15 0352  CHOL 150  HDL 42  LDLCALC 97  TRIG 54  CHOLHDL 3.6   Thyroid Function Tests  Recent Labs  07/28/15 2214  TSH 2.702    Disposition  Pt is being discharged home today in good condition.  Follow-up Plans & Appointments  Follow-up  Information    Follow up with Xu,Jindong, MD. Go on 10/02/2015.   Specialty:  Neurology   Why:  @9 :30am please go 30 mins early   Contact information:   [address] [place] [id number] [phone]       Follow up with Norma FredricksonLORI GERHARDT, NP. Go on 08/12/2015.   Specialties:  Nurse Practitioner, Interventional Cardiology, Cardiology, Radiology   Why:  @2 :30pm for post hospital    Contact information:   [address] [place] [address] [phone]           Discharge Instructions    Ambulatory referral to Neurology    Complete by:  As directed   [facility]. Thanks.     Diet - low sodium heart healthy    Complete by:  As directed      Discharge instructions    Complete by:  As directed   Strongly encouraged to quit smoking.     Increase activity slowly    Complete by:  As directed            F/u Labs/Studies: None  Discharge  Medications    Medication List    TAKE these medications        acetaminophen 500 MG tablet  Commonly known as:  TYLENOL  Take 500 mg by mouth every 6 (six) hours as needed for moderate pain.     methocarbamol 500 MG tablet  Commonly known as:  ROBAXIN  Take 1 tablet (500 mg total) by mouth every 8 (eight) hours as needed for muscle spasms.     oxyCODONE-acetaminophen 5-325 MG tablet  Commonly known as:  PERCOCET/ROXICET  Take 1-2 tablets by mouth every 6 (six) hours as needed for severe pain.     tamsulosin 0.4 MG Caps capsule  Commonly known as:  FLOMAX  Take 1 capsule (0.4 mg total) by mouth daily.     warfarin 5 MG tablet  Commonly known as:  COUMADIN  Take 2.5-5 mg by mouth daily at 6 PM.  daily on Monday and Friday and 2.5mg  daily all other days.        Duration of Discharge Encounter   Greater than 30 minutes including physician time.  SignedManson Passey PA-C 07/29/2015, 4:43 PM

## 2015-07-29 NOTE — Progress Notes (Signed)
  Echocardiogram 2D Echocardiogram has been performed.  Nolon RodBrown, Tony 07/29/2015, 2:35 PM

## 2015-07-29 NOTE — Progress Notes (Signed)
ANTICOAGULATION CONSULT NOTE - Follow Up Consult  Pharmacy Consult for Coumadin Indication: atrial fibrillation  No Known Allergies  Patient Measurements: Height: 6\' 1"  (185.4 cm) Weight: 215 lb 1.6 oz (97.569 kg) IBW/kg (Calculated) : 79.9  Vital Signs: Temp: 98.3 F (36.8 C) (11/22 0831) Temp Source: Oral (11/22 0831) BP: 98/62 mmHg (11/22 0330) Pulse Rate: 47 (11/22 0330)  Labs:  Recent Labs  07/28/15 1541 07/28/15 1554 07/28/15 2214 07/29/15 0352 07/29/15 0950  HGB 14.9 17.0  --   --   --   HCT 44.6 50.0  --   --   --   PLT 139*  --   --   --   --   APTT 40*  --   --   --   --   LABPROT 25.7*  --   --  26.7*  --   INR 2.38*  --   --  2.50*  --   CREATININE 1.22 1.20  --  1.28*  --   TROPONINI  --   --  0.03 0.03 <0.03    Estimated Creatinine Clearance: 62.3 mL/min (by C-G formula based on Cr of 1.28).  Assessment:   INR remains therapeutic (2.50) on home Coumadin regimen.   Mild thrombocytopenia, consistent with prior values. No bleeding noted.  Goal of Therapy:  INR 2-3 Monitor platelets by anticoagulation protocol: Yes   Plan:   Continue Coumadin 2.5 mg daily except 5 mg on Mondays and Wednesdays.  Daily PT/INR.  Dennie Fettersgan, Aleric Froelich [place], [place] Pager: [phone] 07/29/2015,12:15 PM
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
## 2015-07-29 NOTE — Progress Notes (Addendum)
Pt states that he just remembered this and forgot to tell Neurologist this am.  When he gets the headaches, he takes 2 tylenol (says they may be 500mg  each, but does not remember exactly) and this relieves the headache.  He takes these 1-2 times per week.  Some weeks he does not have any headache.    After going over PTA med list, pt remembered that he takes Tylenol (acetaminophen) for his headaches, NOT aspirin.

## 2015-07-29 NOTE — Discharge Instructions (Addendum)
Information on my medicine - Coumadin®   (Warfarin) ° °This medication education was reviewed with me or my healthcare representative as part of my discharge preparation.  The pharmacist that spoke with me during my hospital stay was:  Egan, Theresa Donovan, RPH ° °Why was Coumadin prescribed for you? °Coumadin was prescribed for you because you have a blood clot or a medical condition that can cause an increased risk of forming blood clots. Blood clots can cause serious health problems by blocking the flow of blood to the heart, lung, or brain. Coumadin can prevent harmful blood clots from forming. °As a reminder your indication for Coumadin is:   Stroke Prevention Because Of Atrial Fibrillation ° °What test will check on my response to Coumadin? °While on Coumadin (warfarin) you will need to have an INR test regularly to ensure that your dose is keeping you in the desired range. The INR (international normalized ratio) number is calculated from the result of the laboratory test called prothrombin time (PT). ° °If an INR APPOINTMENT HAS NOT ALREADY BEEN MADE FOR YOU please schedule an appointment to have this lab work done by your health care provider within 7 days. °Your INR goal is usually a number between:  2 to 3 or your provider may give you a more narrow range like 2-2.5.  Ask your health care provider during an office visit what your goal INR is. ° °What  do you need to  know  About  COUMADIN? °Take Coumadin (warfarin) exactly as prescribed by your healthcare provider about the same time each day.  DO NOT stop taking without talking to the doctor who prescribed the medication.  Stopping without other blood clot prevention medication to take the place of Coumadin may increase your risk of developing a new clot or stroke.  Get refills before you run out. ° °What do you do if you miss a dose? °If you miss a dose, take it as soon as you remember on the same day then continue your regularly scheduled regimen the  next day.  Do not take two doses of Coumadin at the same time. ° °Important Safety Information °A possible side effect of Coumadin (Warfarin) is an increased risk of bleeding. You should call your healthcare provider right away if you experience any of the following: °? Bleeding from an injury or your nose that does not stop. °? Unusual colored urine (red or dark brown) or unusual colored stools (red or black). °? Unusual bruising for unknown reasons. °? A serious fall or if you hit your head (even if there is no bleeding). ° °Some foods or medicines interact with Coumadin® (warfarin) and might alter your response to warfarin. To help avoid this: °? Eat a balanced diet, maintaining a consistent amount of Vitamin K. °? Notify your provider about major diet changes you plan to make. °? Avoid alcohol or limit your intake to 1 drink for women and 2 drinks for men per day. °(1 drink is 5 oz. wine, 12 oz. beer, or 1.5 oz. liquor.) ° °Make sure that ANY health care provider who prescribes medication for you knows that you are taking Coumadin (warfarin).  Also make sure the healthcare provider who is monitoring your Coumadin knows when you have started a new medication including herbals and non-prescription products. ° °Coumadin® (Warfarin)  Major Drug Interactions  °Increased Warfarin Effect Decreased Warfarin Effect  °Alcohol (large quantities) °Antibiotics (esp. Septra/Bactrim, Flagyl, Cipro) °Amiodarone (Cordarone) °Aspirin (ASA) °Cimetidine (Tagamet) °Megestrol (Megace) °NSAIDs (ibuprofen,   naproxen, etc.) Piroxicam (Feldene) Propafenone (Rythmol SR) Propranolol (Inderal) Isoniazid (INH) Posaconazole (Noxafil) Barbiturates (Phenobarbital) Carbamazepine (Tegretol) Chlordiazepoxide (Librium) Cholestyramine (Questran) Griseofulvin Oral Contraceptives Rifampin Sucralfate (Carafate) Vitamin K   Coumadin (Warfarin) Major Herbal Interactions  Increased Warfarin Effect Decreased Warfarin Effect   Garlic Ginseng Ginkgo biloba Coenzyme Q10 Green tea [place]. Johns wort    Coumadin (Warfarin) FOOD Interactions  Eat a consistent number of servings per week of foods HIGH in Vitamin K (1 serving =  cup)  Collards (cooked, or boiled & drained) Kale (cooked, or boiled & drained) Mustard greens (cooked, or boiled & drained) Parsley *serving size only =  cup Spinach (cooked, or boiled & drained) Swiss chard (cooked, or boiled & drained) Turnip greens (cooked, or boiled & drained)  Eat a consistent number of servings per week of foods MEDIUM-HIGH in Vitamin K (1 serving = 1 cup)  Asparagus (cooked, or boiled & drained) Broccoli (cooked, boiled & drained, or raw & chopped) Brussel sprouts (cooked, or boiled & drained) *serving size only =  cup Lettuce, raw (green leaf, endive, romaine) Spinach, raw Turnip greens, raw & chopped   These websites have more information on Coumadin (warfarin):  http://www.king-russell.com/; https://www.hines.net/;  Migraine Headache A migraine headache is an intense, throbbing pain on one or both sides of your head. A migraine can last for 30 minutes to several hours. CAUSES  The exact cause of a migraine headache is not always known. However, a migraine may be caused when nerves in the brain become irritated and release chemicals that cause inflammation. This causes pain. Certain things may also trigger migraines, such as:  Alcohol.  Smoking.  Stress.  Menstruation.  Aged cheeses.  Foods or drinks that contain nitrates, glutamate, aspartame, or tyramine.  Lack of sleep.  Chocolate.  Caffeine.  Hunger.  Physical exertion.  Fatigue.  Medicines used to treat chest pain (nitroglycerine), birth control pills, estrogen, and some blood pressure medicines. SIGNS AND SYMPTOMS 1. Pain on one or both sides of your head. 2. Pulsating or throbbing pain. 3. Severe pain that prevents daily activities. 4. Pain that is aggravated by any  physical activity. 5. Nausea, vomiting, or both. 6. Dizziness. 7. Pain with exposure to bright lights, loud noises, or activity. 8. General sensitivity to bright lights, loud noises, or smells. Before you get a migraine, you may get warning signs that a migraine is coming (aura). An aura may include: 1. Seeing flashing lights. 2. Seeing bright spots, halos, or zigzag lines. 3. Having tunnel vision or blurred vision. 4. Having feelings of numbness or tingling. 5. Having trouble talking. 6. Having muscle weakness. DIAGNOSIS  A migraine headache is often diagnosed based on: 1. Symptoms. 2. Physical exam. 3. A CT scan or MRI of your head. These imaging tests cannot diagnose migraines, but they can help rule out other causes of headaches. TREATMENT Medicines may be given for pain and nausea. Medicines can also be given to help prevent recurrent migraines.  HOME CARE INSTRUCTIONS 1. Only take over-the-counter or prescription medicines for pain or discomfort as directed by your health care provider. The use of long-term narcotics is not recommended. 2. Lie down in a dark, quiet room when you have a migraine. 3. Keep a journal to find out what may trigger your migraine headaches. For example, write down: 1. What you eat and drink. 2. How much sleep you get. 3. Any change to your diet or medicines. 4. Limit alcohol consumption. 5. Quit smoking if you smoke. 6. Get 7-9  hours of sleep, or as recommended by your health care provider. 7. Limit stress. 8. Keep lights dim if bright lights bother you and make your migraines worse. SEEK IMMEDIATE MEDICAL CARE IF:  1. Your migraine becomes severe. 2. You have a fever. 3. You have a stiff neck. 4. You have vision loss. 5. You have muscular weakness or loss of muscle control. 6. You start losing your balance or have trouble walking. 7. You feel faint or pass out. 8. You have severe symptoms that are different from your first symptoms. MAKE SURE  YOU:  1. Understand these instructions. 2. Will watch your condition. 3. Will get help right away if you are not doing well or get worse.   This information is not intended to replace advice given to you by your health care provider. Make sure you discuss any questions you have with your health care provider.   Document Released: 08/23/2005 Document Revised: 09/13/2014 Document Reviewed: 04/30/2013 Elsevier Interactive Patient Education 2016 Elsevier Inc[employer]   Heart-Healthy Eating Plan Many factors influence your heart health, including eating and exercise habits. Heart (coronary) risk increases with abnormal blood fat (lipid) levels. Heart-healthy meal planning includes limiting unhealthy fats, increasing healthy fats, and making other small dietary changes. This includes maintaining a healthy body weight to help keep lipid levels within a normal range. WHAT IS MY PLAN?  Your health care provider recommends that you:  Get no more than _________% of the total calories in your daily diet from fat.  Limit your intake of saturated fat to less than _________% of your total calories each day.  Limit the amount of cholesterol in your diet to less than _________ mg per day. WHAT TYPES OF FAT SHOULD I CHOOSE? 9. Choose healthy fats more often. Choose monounsaturated and polyunsaturated fats, such as olive oil and canola oil, flaxseeds, walnuts, almonds, and seeds. 10. Eat more omega-3 fats. Good choices include salmon, mackerel, sardines, tuna, flaxseed oil, and ground flaxseeds. Aim to eat fish at least two times each week. 11. Limit saturated fats. Saturated fats are primarily found in animal products, such as meats, butter, and cream. Plant sources of saturated fats include palm oil, palm kernel oil, and coconut oil. 12. Avoid foods with partially hydrogenated oils in them. These contain trans fats. Examples of foods that contain trans fats are stick margarine, some tub margarines, cookies,  crackers, and other baked goods. WHAT GENERAL GUIDELINES DO I NEED TO FOLLOW? 7. Check food labels carefully to identify foods with trans fats or high amounts of saturated fat. 8. Fill one half of your plate with vegetables and green salads. Eat 4-5 servings of vegetables per day. A serving of vegetables equals 1 cup of raw leafy vegetables,  cup of raw or cooked cut-up vegetables, or  cup of vegetable juice. 9. Fill one fourth of your plate with whole grains. Look for the word "whole" as the first word in the ingredient list. 10. Fill one fourth of your plate with lean protein foods. 11. Eat 4-5 servings of fruit per day. A serving of fruit equals one medium whole fruit,  cup of dried fruit,  cup of fresh, frozen, or canned fruit, or  cup of 100% fruit juice. 12. Eat more foods that contain soluble fiber. Examples of foods that contain this type of fiber are apples, broccoli, carrots, beans, peas, and barley. Aim to get 20-30 g of fiber per day. 13. Eat more home-cooked food and less restaurant, buffet, and fast food. 14.  Limit or avoid alcohol. 15. Limit foods that are high in starch and sugar. 16. Avoid fried foods. 17. Cook foods by using methods other than frying. Baking, boiling, grilling, and broiling are all great options. Other fat-reducing suggestions include: 1. Removing the skin from poultry. 2. Removing all visible fats from meats. 3. Skimming the fat off of stews, soups, and gravies before serving them. 4. Steaming vegetables in water or broth. 18. Lose weight if you are overweight. Losing just 5-10% of your initial body weight can help your overall health and prevent diseases such as diabetes and heart disease. 19. Increase your consumption of nuts, legumes, and seeds to 4-5 servings per week. One serving of dried beans or legumes equals  cup after being cooked, one serving of nuts equals 1 ounces, and one serving of seeds equals  ounce or 1 tablespoon. 20. You may need to  monitor your salt (sodium) intake, especially if you have high blood pressure. Talk with your health care provider or dietitian to get more information about reducing sodium. WHAT FOODS CAN I EAT? Grains Breads, including [place], white, pita, wheat, raisin, rye, oatmeal, and [place]. Tortillas that are neither fried nor made with lard or trans fat. Low-fat rolls, including hotdog and hamburger buns and English muffins. Biscuits. Muffins. Waffles. Pancakes. Light popcorn. Whole-grain cereals. Flatbread. Melba toast. Pretzels. Breadsticks. Rusks. Low-fat snacks and crackers, including oyster, saltine, matzo, graham, animal, and rye. Rice and pasta, including brown rice and those that are made with whole wheat. Vegetables All vegetables. Fruits All fruits, but limit coconut. Meats and Other Protein Sources Lean, well-trimmed beef, veal, pork, and lamb. Chicken and [place] without skin. All fish and shellfish. Wild duck, rabbit, pheasant, and venison. Egg whites or low-cholesterol egg substitutes. Dried beans, peas, lentils, and tofu.Seeds and most nuts. Dairy Low-fat or nonfat cheeses, including ricotta, string, and mozzarella. Skim or 1% milk that is liquid, powdered, or evaporated. Buttermilk that is made with low-fat milk. Nonfat or low-fat yogurt. Beverages Mineral water. Diet carbonated beverages. Sweets and Desserts Sherbets and fruit ices. Honey, jam, marmalade, jelly, and syrups. Meringues and gelatins. Pure sugar candy, such as hard candy, jelly beans, gumdrops, mints, marshmallows, and small amounts of dark chocolate. [employer]. Eat all sweets and desserts in moderation. Fats and Oils Nonhydrogenated (trans-free) margarines. Vegetable oils, including soybean, sesame, sunflower, olive, peanut, safflower, corn, canola, and cottonseed. Salad dressings or mayonnaise that are made with a vegetable oil. Limit added fats and oils that you use for cooking, baking, salads, and as  spreads. Other Cocoa powder. Coffee and tea. All seasonings and condiments. The items listed above may not be a complete list of recommended foods or beverages. Contact your dietitian for more options. WHAT FOODS ARE NOT RECOMMENDED? Grains Breads that are made with saturated or trans fats, oils, or whole milk. Croissants. Butter rolls. Cheese breads. Sweet rolls. Donuts. Buttered popcorn. Chow mein noodles. High-fat crackers, such as cheese or butter crackers. Meats and Other Protein Sources Fatty meats, such as hotdogs, short ribs, sausage, spareribs, bacon, ribeye roast or steak, and mutton. High-fat deli meats, such as salami and bologna. Caviar. Domestic duck and goose. Organ meats, such as kidney, liver, sweetbreads, brains, gizzard, chitterlings, and heart. Dairy Cream, sour cream, cream cheese, and creamed cottage cheese. Whole milk cheeses, including blue (bleu), [address], [place], [place], [address], [place], [address], [place], [place], and [place]. Whole or 2% milk that is liquid, evaporated, or condensed. Whole buttermilk. Cream sauce or high-fat cheese sauce. Yogurt  that is made from whole milk. Beverages Regular sodas and drinks with added sugar. Sweets and Desserts Frosting. Pudding. Cookies. Cakes other than angel food cake. Candy that has milk chocolate or white chocolate, hydrogenated fat, butter, coconut, or unknown ingredients. Buttered syrups. Full-fat ice cream or ice cream drinks. Fats and Oils Gravy that has suet, meat fat, or shortening. Cocoa butter, hydrogenated oils, palm oil, coconut oil, palm kernel oil. These can often be found in baked products, candy, fried foods, nondairy creamers, and whipped toppings. Solid fats and shortenings, including bacon fat, salt pork, lard, and butter. Nondairy cream substitutes, such as coffee creamers and sour cream substitutes. Salad dressings that are made of unknown oils, cheese, or sour cream. The items listed above may not be a  complete list of foods and beverages to avoid. Contact your dietitian for more information.   This information is not intended to replace advice given to you by your health care provider. Make sure you discuss any questions you have with your health care provider.   Document Released: 06/01/2008 Document Revised: 09/13/2014 Document Reviewed: 02/14/2014 Elsevier Interactive Patient Education 2016 [employer].   Steps to Quit Smoking  Smoking tobacco can be harmful to your health and can affect almost every organ in your body. Smoking puts you, and those around you, at risk for developing many serious chronic diseases. Quitting smoking is difficult, but it is one of the best things that you can do for your health. It is never too late to quit. WHAT ARE THE BENEFITS OF QUITTING SMOKING? When you quit smoking, you lower your risk of developing serious diseases and conditions, such as:  Lung cancer or lung disease, such as COPD.  Heart disease.  Stroke.  Heart attack.  Infertility.  Osteoporosis and bone fractures. Additionally, symptoms such as coughing, wheezing, and shortness of breath may get better when you quit. You may also find that you get sick less often because your body is stronger at fighting off colds and infections. If you are pregnant, quitting smoking can help to reduce your chances of having a baby of low birth weight. HOW DO I GET READY TO QUIT? When you decide to quit smoking, create a plan to make sure that you are successful. Before you quit: 13. Pick a date to quit. Set a date within the next two weeks to give you time to prepare. 14. Write down the reasons why you are quitting. Keep this list in places where you will see it often, such as on your bathroom mirror or in your car or wallet. 15. Identify the people, places, things, and activities that make you want to smoke (triggers) and avoid them. Make sure to take these actions: 1. Throw away all cigarettes at  home, at work, and in your car. 2. Throw away smoking accessories, such as Set designer. 3. Clean your car and make sure to empty the ashtray. 4. Clean your home, including curtains and carpets. 16. Tell your family, friends, and coworkers that you are quitting. Support from your loved ones can make quitting easier. 17. Talk with your health care provider about your options for quitting smoking. 18. Find out what treatment options are covered by your health insurance. WHAT STRATEGIES CAN I USE TO QUIT SMOKING?  Talk with your healthcare provider about different strategies to quit smoking. Some strategies include: 21. Quitting smoking altogether instead of gradually lessening how much you smoke over a period of time. Research shows that quitting "cold [place]"  is more successful than gradually quitting. 22. Attending in-person counseling to help you build problem-solving skills. You are more likely to have success in quitting if you attend several counseling sessions. Even short sessions of 10 minutes can be effective. 23. Finding resources and support systems that can help you to quit smoking and remain smoke-free after you quit. These resources are most helpful when you use them often. They can include: 1. Online chats with a Veterinary surgeon. 2. Telephone quitlines. 3. Automotive engineer. 4. Support groups or group counseling. 5. Text messaging programs. 6. Mobile phone applications. 24. Taking medicines to help you quit smoking. (If you are pregnant or breastfeeding, talk with your health care provider first.) Some medicines contain nicotine and some do not. Both types of medicines help with cravings, but the medicines that include nicotine help to relieve withdrawal symptoms. Your health care provider may recommend: 1. Nicotine patches, gum, or lozenges. 2. Nicotine inhalers or sprays. 3. Non-nicotine medicine that is taken by mouth. Talk with your health care provider about  combining strategies, such as taking medicines while you are also receiving in-person counseling. Using these two strategies together makes you more likely to succeed in quitting than if you used either strategy on its own. If you are pregnant or breastfeeding, talk with your health care provider about finding counseling or other support strategies to quit smoking. Do not take medicine to help you quit smoking unless told to do so by your health care provider. WHAT THINGS CAN I DO TO MAKE IT EASIER TO QUIT? Quitting smoking might feel overwhelming at first, but there is a lot that you can do to make it easier. Take these important actions: 4. Reach out to your family and friends and ask that they support and encourage you during this time. Call telephone quitlines, reach out to support groups, or work with a counselor for support. 5. Ask people who smoke to avoid smoking around you. 6. Avoid places that trigger you to smoke, such as bars, parties, or smoke-break areas at work. 7. Spend time around people who do not smoke. 8. Lessen stress in your life, because stress can be a smoking trigger for some people. To lessen stress, try: 1. Exercising regularly. 2. Deep-breathing exercises. 3. Yoga. 4. Meditating. 5. Performing a body scan. This involves closing your eyes, scanning your body from head to toe, and noticing which parts of your body are particularly tense. Purposefully relax the muscles in those areas. 9. Download or purchase mobile phone or tablet apps (applications) that can help you stick to your quit plan by providing reminders, tips, and encouragement. There are many free apps, such as QuitGuide from the [employer] developer for Disease Control and Prevention). You can find other support for quitting smoking (smoking cessation) through smokefree.gov and other websites. HOW WILL I FEEL WHEN I QUIT SMOKING? Within the first 24 hours of quitting smoking, you may start to feel some withdrawal  symptoms. These symptoms are usually most noticeable 2-3 days after quitting, but they usually do not last beyond 2-3 weeks. Changes or symptoms that you might experience include: 9. Mood swings. 10. Restlessness, anxiety, or irritation. 11. Difficulty concentrating. 12. Dizziness. 13. Strong cravings for sugary foods in addition to nicotine. 14. Mild weight gain. 15. Constipation. 16. Nausea. 17. Coughing or a sore throat. 18. Changes in how your medicines work in your body. 19. A depressed mood. 20. Difficulty sleeping (insomnia). After the first 2-3 weeks of quitting, you may start to notice  more positive results, such as: 9. Improved sense of smell and taste. 10. Decreased coughing and sore throat. 11. Slower heart rate. 12. Lower blood pressure. 13. Clearer skin. 14. The ability to breathe more easily. 15. Fewer sick days. Quitting smoking is very challenging for most people. Do not get discouraged if you are not successful the first time. Some people need to make many attempts to quit before they achieve long-term success. Do your best to stick to your quit plan, and talk with your health care provider if you have any questions or concerns.   This information is not intended to replace advice given to you by your health care provider. Make sure you discuss any questions you have with your health care provider.   Document Released: 08/17/2001 Document Revised: 01/07/2015 Document Reviewed: 01/07/2015 Elsevier Interactive Patient Education 2016 [employer].  Smoking Cessation Counseling   Any form of tobacco products used in the last seven days?   In an average day, how many cigarettes does the patient smoke?   Brand of cigarette smoked?   After waking up, how many minutes usually pass before they smoke their first cigarette?   In past stop-smoking attempts, what medications have been tried?   How many caffeinated beverages are consumed every day?   How many  alcoholic beverages are consumed every day?   Other forms of tobacco used once a week or more?       (i.e. Pipes, cigars, snuff, chewing tobacco)   Physical signs of excess tension?       (i.e. Headaches, difficulty sleeping, upset stomach)   Over the last 2 weeks, has the patient often felt downhearted, depressed, and hopeless?   Does patient have history of being very depressed and hopeless every day for 2 weeks or longer?   On anti-depressant medication(s)?   Hypertension?   Heart attack in the past 6 months?   Serious skin condition or allergy?   History of seizure?   If male, pregnant or breast feeding?   History of an eating disorder?   Can the patient be contacted for a follow up consult by phone?

## 2015-07-30 DIAGNOSIS — H538 Other visual disturbances: Secondary | ICD-10-CM | POA: Insufficient documentation

## 2015-07-30 LAB — HEMOGLOBIN A1C
HEMOGLOBIN A1C: 5.8 % — AB (ref 4.8–5.6)
MEAN PLASMA GLUCOSE: 120 mg/dL

## 2015-08-12 ENCOUNTER — Encounter: Payer: Self-pay | Admitting: Cardiology

## 2015-08-12 ENCOUNTER — Ambulatory Visit (INDEPENDENT_AMBULATORY_CARE_PROVIDER_SITE_OTHER): Payer: Medicare Other | Admitting: Cardiology

## 2015-08-12 VITALS — BP 134/78 | HR 74 | Ht 73.0 in | Wt 230.4 lb

## 2015-08-12 DIAGNOSIS — Z7901 Long term (current) use of anticoagulants: Secondary | ICD-10-CM | POA: Diagnosis not present

## 2015-08-12 DIAGNOSIS — Z5181 Encounter for therapeutic drug level monitoring: Secondary | ICD-10-CM

## 2015-08-12 DIAGNOSIS — I482 Chronic atrial fibrillation: Secondary | ICD-10-CM | POA: Diagnosis not present

## 2015-08-12 DIAGNOSIS — H538 Other visual disturbances: Secondary | ICD-10-CM | POA: Diagnosis not present

## 2015-08-12 DIAGNOSIS — I4821 Permanent atrial fibrillation: Secondary | ICD-10-CM

## 2015-08-12 NOTE — Patient Instructions (Signed)
Medication Instructions:  Your physician recommends that you continue on your current medications as directed. Please refer to the Current Medication list given to you today.   Labwork: NONE  Testing/Procedures: NONE  Follow-UP Your physician wants you to follow-up in: 6 MONTHS  WITH  DR Marlou StarksSMITH You will receive a reminder letter in the mail two months in advance. If you don't receive a letter, please call our office to schedule the follow-up appointment.  Any Other Special Instructions Will Be Listed Below (If Applicable).     If you need a refill on your cardiac medications before your next appointment, please call your pharmacy.

## 2015-08-12 NOTE — Progress Notes (Signed)
Cardiology Office Note   Date:  08/12/2015   ID:  Jesse Quinn, DOB 07/10/1941, MRN [id number]  PCP:  Jesse Quinn,Jesse JOSEPH, MD  Cardiologist:  Dr. Katrinka BlazingSmith    Chief Complaint  Patient presents with  . Atrial Fibrillation    no further blurred vision.      History of Present Illness: Jesse DoneRichard Quinn is a 74 y.o. male who presents for post hospitalization 07/28/15 to 07/29/15.  He presented with blurry vision -bil, that lasted, seen by Neuro etiology unclear.   He has a A. fib for the past 30-40 years. No history of ablation or cardioversion. [place]. He takes Coumadin for anticoagulation, managed by PCP. Not on any rate control medicine. He states his rates always runs in 50s. No prior history of heart attack or echocardiogram. Never seen by cardiologist. He goes to gym 3 times in a week. Former runner. No exertional chest pain or shortness of breath. The patient denies nausea, vomiting, fever, chest pain, palpitations, shortness of breath, orthopnea, PND, dizziness, syncope, cough, congestion, abdominal pain, hematochezia, melena, lower extremity edema. Denies history of hypertension, hyperlipidemia, diabetes, stroke or TIA.  CTA head and neck not revealing. Low suspicious for stroke.  Today no complaints, no chest pain and no SOB.  No further blurred vision.  He stated his sister has migraines that begin with blurred vision.  He feels well.    Past Medical History  Diagnosis Date  . Dysrhythmia     AF     . Arthritis     Past Surgical History  Procedure Laterality Date  . Tonsillectomy    . Appendectomy    . 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 acetaminophen (TYLENOL) 500 MG tablet Take 500 mg by mouth every 6  (six) hours as needed for moderate pain.    Marland Kitchen. amoxicillin (AMOXIL) 500 MG capsule Take four (4) capsules (2,000 mg total) by mouth as needed prior to dental procedures.    . warfarin (COUMADIN) 5 MG tablet Take 2.5-5 mg by mouth daily at 6 PM. 5mg  daily on Monday and Friday and 2.5mg  daily all other days.     No current facility-administered medications for this visit.    Allergies:   Review of patient's allergies indicates no known allergies.    Social History:  The patient  reports that he quit smoking about 2 weeks ago. His smoking use included Cigars. He has never used smokeless tobacco. He reports that he drinks alcohol. He reports that he does not use illicit drugs.   Family History:  The patient's family history includes Healthy in his brother; Heart attack in his father; Heart disease in his father; Macular degeneration in his sister; Other in his brother and mother.    ROS:  General:no colds or fevers, no weight changes Skin:no rashes or ulcers HEENT:no blurred vision, no congestion CV:see HPI PUL:see HPI GI:no diarrhea constipation or melena, no indigestion GU:no hematuria, no dysuria MS:no joint pain, no claudication Neuro:no syncope, no lightheadedness Endo:no diabetes, no thyroid disease  Wt Readings from Last 3 Encounters:  08/12/15 230 lb 6.4 oz (104.509 kg)  07/29/15 215 lb 1.6 oz (97.569 kg)  09/13/14 215 lb (97.523 kg)     PHYSICAL EXAM: VS:  BP  134/78 mmHg  Pulse 74  Ht  (1.854 m)  Wt 230 lb 6.4 oz (104.509 kg)  BMI 30.40 kg/m2  SpO2 98% , BMI Body mass index is 30.4 kg/(m^2). General:Pleasant affect, NAD Skin:Warm and dry, brisk capillary refill HEENT:normocephalic, sclera clear, mucus membranes moist Neck:supple, no JVD, no bruits  Heart:S1S2 mildly irregular without murmur, gallup, rub or click Lungs:clear without rales, rhonchi, or wheezes VWU:JWJX, non tender, + BS, do not palpate liver spleen or masses Ext:no lower ext edema, 2+ pedal  pulses, 2+ radial pulses Neuro:alert and oriented, MAE, follows commands, + facial symmetry    EKG:  EKG is NOT ordered today.    Recent Labs: 07/28/2015: ALT 21; Hemoglobin 17.0; Platelets 139*; TSH 2.702 07/29/2015: BUN 15; Creatinine, Ser 1.28*; Potassium 5.1; Sodium 143    Lipid Panel    Component Value Date/Time   CHOL 150 07/29/2015 0352   TRIG 54 07/29/2015 0352   HDL 42 07/29/2015 0352   CHOLHDL 3.6 07/29/2015 0352   VLDL 11 07/29/2015 0352   LDLCALC 97 07/29/2015 0352       Other studies Reviewed: Additional studies/ records that were reviewed today include: discharge summary. . Echo 07/29/15 LV EF: 60% -  65%  ------------------------------------------------------------------- Indications:   Atrial fibrillation - 427.31.  ------------------------------------------------------------------- History:  Risk factors: Current tobacco use.  ------------------------------------------------------------------- Study Conclusions  - Left ventricle: The cavity size was normal. Wall thickness was increased in a pattern of mild LVH. Systolic function was normal. The estimated ejection fraction was in the range of 60% to 65%. - Aortic valve: There was mild regurgitation. - Left atrium: The atrium was severely dilated. - Right ventricle: The cavity size was severely dilated. - Right atrium: The atrium was massively dilated. - Pulmonary arteries: Systolic pressure was moderately increased. PA peak pressure: 44 mm Hg (S).   ASSESSMENT AND PLAN:  1.  Blurred vision possible migraine but no CT changes. To follow up jwit neuro next month.  2. Permanent a fib.  On anticoagulation with CHADSVASc score 1 continue coumadin, has INR with PCP on Friday.  Follow up with Dr. Katrinka Blazing in 6 months.   3. Tobacco smoking - has stopped cigars!!    Current medicines are reviewed with the patient today.  The patient Has no concerns regarding medicines.  The following  changes have been made:  See above Labs/ tests ordered today include:see above  Disposition:   FU:  see above  Signed, Leone Brand, NP  08/12/2015 3:17 PM    [facility] [address] [place], [place], [place]  [address]/ [address] [place], [place] Phone: [phone]; Fax: [phone]  [phone]

## 2015-10-02 ENCOUNTER — Ambulatory Visit (INDEPENDENT_AMBULATORY_CARE_PROVIDER_SITE_OTHER): Payer: Medicare Other | Admitting: Neurology

## 2015-10-02 ENCOUNTER — Encounter: Payer: Self-pay | Admitting: Neurology

## 2015-10-02 VITALS — BP 120/69 | HR 69 | Ht 73.0 in | Wt 225.2 lb

## 2015-10-02 DIAGNOSIS — I480 Paroxysmal atrial fibrillation: Secondary | ICD-10-CM | POA: Diagnosis not present

## 2015-10-02 DIAGNOSIS — G43109 Migraine with aura, not intractable, without status migrainosus: Secondary | ICD-10-CM | POA: Insufficient documentation

## 2015-10-02 DIAGNOSIS — Z7901 Long term (current) use of anticoagulants: Secondary | ICD-10-CM

## 2015-10-02 NOTE — Progress Notes (Signed)
STROKE NEUROLOGY FOLLOW UP NOTE  NAME: Jesse Quinn DOB: 15-Apr-1941  REASON FOR VISIT: stroke follow up HISTORY FROM: pt and chart  Today we had the pleasure of seeing Jesse Quinn in follow-up at our Neurology Clinic. Pt was accompanied by no one.   History Summary Jesse Quinn is a 75 y.o. male with history of atrial fibrillation on coumadin and arthritis was admitted on 07/28/15 for transient vision loss bilaterally. Resolved within minutes. 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 CTA head and neck negative for carotid stenosis. TTE, A1C unremarkable and LDL 97. He did have mild HA that day. 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 His INR 2.38 on admission. He was discharged in good condition with continued coumadin.  Interval History During the interval time, the patient has been doing well. No recurrent visual symptoms. He stated that his sister has typical migraine with visual aura, but not sure about his brother or parents. He had daily mild HA in the morning, no need any meds, goes away in one hour. Last INR check 2.5 and he has quit smoking. Bp today 120/69.    REVIEW OF SYSTEMS: Full 14 system review of systems performed and notable only for those listed below and in HPI above, all others are negative:  Constitutional:   Cardiovascular:  Ear/Nose/Throat:   Skin:  Eyes:   Respiratory:   Gastroitestinal:   Genitourinary:  Hematology/Lymphatic:   Endocrine:  Musculoskeletal:   Allergy/Immunology:   Neurological:   Psychiatric:  Sleep:   The following represents the patient's updated allergies and side effects list: No Known Allergies  The neurologically relevant items on the patient's problem list were reviewed on today's visit.  Neurologic Examination  A problem focused neurological exam (12 or more points of the  single system neurologic examination, vital signs counts as 1 point, cranial nerves count for 8 points) was performed.  Blood pressure 120/69, pulse 69, height  (1.854 m), weight 225 lb 3.2 oz (102.15 kg).  General - Well nourished, well developed, in no apparent distress.  Ophthalmologic - Fundi not visualized due to eye movement.  Cardiovascular - Regular rate and rhythm with no murmur.  Mental Status -  Level of arousal and orientation to time, place, and person were intact. Language including expression, naming, repetition, comprehension was assessed and found intact. Fund of Knowledge was assessed and was intact.  Cranial Nerves II - XII - II - Visual field intact OU. III, IV, VI - Extraocular movements intact. V - Facial sensation intact bilaterally. VII - Facial movement intact bilaterally. VIII - Hearing & vestibular intact bilaterally. X - Palate elevates symmetrically. XI - Chin turning & shoulder shrug intact bilaterally. XII - Tongue protrusion intact.  Motor Strength - The patient's strength was normal in all extremities and pronator drift was absent.  Bulk was normal and fasciculations were absent.   Motor Tone - Muscle tone was assessed at the neck and appendages and was normal.  Reflexes - The patient's reflexes were 1+ in all extremities and he had no pathological reflexes.  Sensory - Light touch, temperature/pinprick, vibration and proprioception, and Romberg testing were assessed and were normal.    Coordination - The patient had normal movements in the hands and feet with no ataxia or dysmetria.  Tremor was absent.  Gait and Station - The patient's transfers, posture, gait, station, and turns  were observed as normal.  Data reviewed: I personally reviewed the images and agree with the radiology interpretations.  MRI HEAD 07/28/2015 No acute infarct. Remote inferior medial right cerebellar infarct. Global atrophy without hydrocephalus.  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 Internal carotid arteries are ectatic. Anterior circulation without medium or large size vessel significant stenosis or occlusion. Middle cerebral artery branch vessel narrowing and irregularity bilaterally. A2 segment anterior cerebral artery narrowing and irregularity bilaterally. Left vertebral artery is dominant. No significant stenosis of the distal vertebral arteries. Poor delineation of the mid to distal right posterior inferior cerebellar artery consistent with patient's remote infarct. Mild narrowing and irregularity of portions of the left posterior inferior cerebellar artery. Fenestrated proximal basilar artery. Bulbous appearance of the basilar tip without saccular aneurysm. Nonvisualized anterior inferior cerebellar arteries. Mild narrowing and irregularity posterior cerebral artery branches bilaterally.   CTA Head and neck - 1. No acute or focal lesion to explain the patient's vision loss were headaches. 2. Remote inferior right cerebellar infarcts. 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 4. Mild atherosclerotic calcifications at the carotid bifurcations bilaterally without significant stenosis. 5. Marked tortuosity of the cervical internal carotid arteries, right greater than left, without significant associated stenosis. 6. No significant disease at the level of the ophthalmic artery origins.  2D echo - - Left ventricle: The cavity size was normal. Wall thickness was increased in a pattern of mild LVH. Systolic function was normal. The estimated ejection fraction was in the range of 60% to 65%. - Aortic valve: There was mild regurgitation. - Left atrium: The atrium was severely dilated. - Right ventricle: The cavity size was severely dilated. - Right atrium: The atrium was massively dilated. - Pulmonary arteries: Systolic pressure was moderately  increased. PA peak pressure: 44 mm Hg (S).  Component     Latest Ref Rng 07/28/2015 07/29/2015  Cholesterol     0 - 200 mg/dL  161  Triglycerides     <150 mg/dL  54  HDL Cholesterol     >40 mg/dL  42  Total CHOL/HDL Ratio       3.6  VLDL     0 - 40 mg/dL  11  LDL (calc)     0 - 99 mg/dL  97  Hemoglobin W9U     4.8 - 5.6 %  5.8 (H)  Mean Plasma Glucose       120  TSH     0.350 - 4.500 uIU/mL 2.702     Assessment: As you may recall, he is a 75 y.o. Caucasian male with PMH of afib on coumadin was admitted on 07/28/15 for transient vision loss bilaterally. Resolved within minutes. 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 CTA head and neck negative for carotid stenosis. TTE, A1C unremarkable and LDL 97. His INR 2.38 on admission. DDx includes migraine, cerebral hypoperfusion, seizure, b/l papilledema. 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 He was discharged in good condition with continued coumadin. His sister has typical migraine with visual aura. During the interval time, no recurrent vision changes. He has daily mild HA in the morning, goes away in one hour. Last INR check 2.5 and he has quit smoking.    Plan:  - continue coumadin for stroke prevention. INR goal 2-3 - continue clinic observation - Follow up with your primary care physician for stroke risk factor  modification. Recommend maintain blood pressure goal <130/80, diabetes with hemoglobin A1c goal below 6.5% and lipids with LDL cholesterol goal below 70 mg/dL.  - check BP at home periodically  - follow up as needed.   No orders of the defined types were placed in this encounter.    Meds ordered this encounter  Medications  . DUREZOL 0.05 % EMUL    Sig:   . ILEVRO 0.3 % ophthalmic suspension    Sig:     Patient Instructions  - continue coumadin for stroke prevention. INR goal 2-3 - continue clinic observation - Follow up  with your primary care physician for stroke risk factor modification. Recommend maintain blood pressure goal <130/80, diabetes with hemoglobin A1c goal below 6.5% and lipids with LDL cholesterol goal below 70 mg/dL.  - check BP at home periodically  - follow up as needed.     Marvel Plan, MD PhD [facility] [address] [place], [place] [address] [phone]

## 2015-10-02 NOTE — Patient Instructions (Signed)
-   continue coumadin for stroke prevention. INR goal 2-3 - continue clinic observation - Follow up with your primary care physician for stroke risk factor modification. Recommend maintain blood pressure goal <130/80, diabetes with hemoglobin A1c goal below 6.5% and lipids with LDL cholesterol goal below 70 mg/dL.  - check BP at home periodically  - follow up as needed.

## 2016-05-11 ENCOUNTER — Ambulatory Visit: Payer: Medicare Other | Admitting: Interventional Cardiology

## 2016-05-25 ENCOUNTER — Ambulatory Visit: Payer: Medicare Other | Admitting: Interventional Cardiology

## 2016-06-16 ENCOUNTER — Encounter: Payer: Self-pay | Admitting: Interventional Cardiology

## 2016-06-16 ENCOUNTER — Ambulatory Visit (INDEPENDENT_AMBULATORY_CARE_PROVIDER_SITE_OTHER): Payer: Medicare Other | Admitting: Interventional Cardiology

## 2016-06-16 VITALS — BP 132/78 | HR 59 | Ht 74.0 in | Wt 224.8 lb

## 2016-06-16 DIAGNOSIS — Z7901 Long term (current) use of anticoagulants: Secondary | ICD-10-CM

## 2016-06-16 DIAGNOSIS — I482 Chronic atrial fibrillation, unspecified: Secondary | ICD-10-CM

## 2016-06-16 DIAGNOSIS — I480 Paroxysmal atrial fibrillation: Secondary | ICD-10-CM

## 2016-06-16 NOTE — Progress Notes (Signed)
Cardiology Office Note    Date:  06/16/2016   ID:  Jesse Quinn, DOB 02-17-1941, MRN [id number]  PCP:  Lillia MountainGRIFFIN,Jesse JOSEPH, MD  Cardiologist: Jesse NoeHenry W Smith III, MD   Chief Complaint  Patient presents with  . Atrial Fibrillation    History of Present Illness:  Jesse DoneRichard Quinn is a 75 y.o. male male who presents for post hospitalization 07/28/15 to 07/29/15.  He presented with blurry vision -bil, that lasted, seen by Neuro etiology unclear. Has chronic atrial fibrillation.  Doing well. 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 No symptoms of dizziness, weakness, syncope, or other complaints.  Past Medical History:  Diagnosis Date  . Arthritis   . Atrial fibrillation (HCC)   . Dysrhythmia    AF       Past Surgical History:  Procedure Laterality Date  . APPENDECTOMY    . TONSILLECTOMY    . 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 acetaminophen (TYLENOL) 500 MG tablet Take 500 mg by mouth every 6 (six) hours as needed for moderate pain.    Marland Kitchen. amoxicillin (AMOXIL) 500 MG capsule Take four (4) capsules by mouth as needed one (1) hour prior to dental procedures. Reported on 10/02/2015    . DUREZOL 0.05 % EMUL Place 1 drop into the left eye 4 (four) times daily.     Marland Kitchen. warfarin (COUMADIN) 5 MG tablet Take 2.5-5 mg by mouth daily at 6 PM. 5mg  daily on Monday and Friday and 2.5mg  daily all other days.    . ILEVRO 0.3 % ophthalmic suspension      No facility-administered medications prior to visit.      Allergies:   Review of patient's allergies indicates no known allergies.   Social History   Social History  . Marital status: Married    Spouse name: N/A  . Number of children: N/A  . Years of education: N/A    Social History Main Topics  . Smoking status: Former Smoker    Types: Cigars    Quit date: 07/28/2015  . Smokeless tobacco: Never Used  . Alcohol use 0.6 oz/week    1 Cans of beer per week     Comment: weekly  . Drug use: No  . Sexual activity: Not Asked   Other Topics Concern  . None   Social History Narrative  . None     Family History:  The patient's family history includes Healthy in his brother; Heart attack in his father; Heart disease in his father; Macular degeneration in his sister; Migraines in his sister; Other in his brother and mother.   ROS:   Please see the history of present illness.    [place] no if he should take omega-3 Fish all. We decided against this because of Coumadin anticoagulation therapy and increased risk of bleeding.  All other systems reviewed and are negative.   PHYSICAL EXAM:   VS:  BP 132/78   Pulse (!) 59   Ht 6\' 2"  (1.88 m)   Wt 224 lb 12.8 oz (102 kg)   BMI 28.86 kg/m    GEN: Well nourished, well developed, in no acute distress  HEENT: normal  Neck: no JVD, carotid bruits, or masses Cardiac: IIRR; no murmurs, rubs, or gallops,no  edema  Respiratory:  clear to auscultation bilaterally, normal work of breathing GI: soft, nontender, nondistended, + BS MS: no deformity or atrophy  Skin: warm and dry, no rash Neuro:  Alert and Oriented x 3, Strength and sensation are intact Psych: euthymic mood, full affect  Wt Readings from Last 3 Encounters:  06/16/16 224 lb 12.8 oz (102 kg)  10/02/15 225 lb 3.2 oz (102.2 kg)  08/12/15 230 lb 6.4 oz (104.5 kg)      Studies/Labs Reviewed:   EKG:  EKG  Atrial fibrillation with slow ventricular response, prominent voltage, Abigail year  Recent Labs: 07/28/2015: ALT 21; Hemoglobin 17.0; Platelets 139; TSH 2.702 07/29/2015: BUN 15; Creatinine, Ser 1.28; Potassium 5.1; Sodium 143   Lipid Panel    Component Value Date/Time   CHOL 150 07/29/2015 0352   TRIG 54 07/29/2015 0352   HDL 42  07/29/2015 0352   CHOLHDL 3.6 07/29/2015 0352   VLDL 11 07/29/2015 0352   LDLCALC 97 07/29/2015 0352    Additional studies/ records that were reviewed today include:  Echocardiogram 111/2016 ------------------------------------------------------------------- Study Conclusions  - Left ventricle: The cavity size was normal. Wall thickness was   increased in a pattern of mild LVH. Systolic function was normal.   The estimated ejection fraction was in the range of 60% to 65%. - Aortic valve: There was mild regurgitation. - Left atrium: The atrium was severely dilated. - Right ventricle: The cavity size was severely dilated. - Right atrium: The atrium was massively dilated. - Pulmonary arteries: Systolic pressure was moderately increased.   PA peak pressure: 44 mm Hg (S).    ASSESSMENT:    1. Paroxysmal atrial fibrillation (HCC)   2. Chronic a-fib (HCC)   3. Chronic anticoagulation      PLAN:  In order of problems listed above:  1. One-year follow-up. Continue to monitor for evidence of high-grade AV block and need for pacemaker. 2. Continuing Coumadin clinic. 3. Removed paroxysmal atrial fibrillation from the problem list.    Medication Adjustments/Labs and Tests Ordered: Current medicines are reviewed at length with the patient today.  Concerns regarding medicines are outlined above.  Medication changes, Labs and Tests ordered today are listed in the Patient Instructions below. Patient Instructions  Medication Instructions:  None  Labwork: None  Testing/Procedures: None  Follow-Up: Your physician wants you to follow-up in: 1 year with Dr.Smith. You will receive a reminder letter in the mail two months in advance. If you don't receive a letter, please call our office to schedule the follow-up appointment.   Any Other Special Instructions Will Be Listed Below (If Applicable).     If you need a refill on your cardiac medications before your next appointment,  please call your pharmacy.      Signed, Jesse Noe, MD  06/16/2016 9:46 AM    [facility] [address] [place], [place], [place]  [address] Phone: [phone]; Fax: [phone]

## 2016-06-16 NOTE — Patient Instructions (Signed)

## 2016-08-04 ENCOUNTER — Emergency Department (HOSPITAL_COMMUNITY)
Admission: EM | Admit: 2016-08-04 | Discharge: 2016-08-06 | Disposition: E | Payer: Medicare Other | Attending: Emergency Medicine | Admitting: Emergency Medicine

## 2016-08-04 DIAGNOSIS — Z96652 Presence of left artificial knee joint: Secondary | ICD-10-CM | POA: Insufficient documentation

## 2016-08-04 DIAGNOSIS — Z87891 Personal history of nicotine dependence: Secondary | ICD-10-CM | POA: Diagnosis not present

## 2016-08-04 DIAGNOSIS — I469 Cardiac arrest, cause unspecified: Secondary | ICD-10-CM | POA: Diagnosis present

## 2016-08-04 DIAGNOSIS — Z7901 Long term (current) use of anticoagulants: Secondary | ICD-10-CM | POA: Diagnosis not present

## 2016-08-04 MED ORDER — EPINEPHRINE PF 1 MG/10ML IJ SOSY
PREFILLED_SYRINGE | INTRAMUSCULAR | Status: AC | PRN
  Administered 2016-08-04: 1 mg via INTRAVENOUS

## 2016-08-06 NOTE — Code Documentation (Signed)
No family has arrived yet. Pt has $226 cash in wallet. G-shock watch, stations of the cross, rosary at bedside. Yellow colored wedding band remains on left ring finger.

## 2016-08-06 NOTE — Progress Notes (Signed)
Responded to trauma page to provide support  to wife and friend of patient.  Patient came to ED as CPR and passed away shortly thereafter.  Wife requested that priest be called.  Call was made and priest came very quickly and I escorted them to bedside. Facilitated information sharing between staff and family.  Provided emotional and spiritutal support and ministry of presence. Remained with family until their departure.   09/30/2015 1000  Clinical Encounter Type  Visited With Patient;Family;Patient and family together;Health care provider  Visit Type Initial;Spiritual support;Death;ED  Referral From Nurse  Spiritual Encounters  Spiritual Needs Emotional;Grief support  Stress Factors  Family Stress Factors Loss  Jesse Quinn, Jesse Quinn, Chaplain,pager [phone]

## 2016-08-06 NOTE — ED Notes (Signed)
This RN and Dr. Particia NearingHaviland spoke w/ wife and wife's neighbor. Ray, Chaplain with family at this time. Dr. Particia NearingHaviland and Ray attempting contact pt's son.

## 2016-08-06 NOTE — ED Notes (Signed)
Contacted [facility] and spoke w/ [employer] [place]. Pt is eligible for tissue only donation. Referral #[id number]#07/15/2016-030

## 2016-08-06 NOTE — Code Documentation (Signed)
Per EMS - pt found down on ground outdoors, unknown downtime. EMS arrival, initially in asystole. 2 min CPR, rhythm VTach. Shocked x 2, given 300 amiodarone. Rhythm returned to asystole. 50 min CPR, 9 epi, still asystole on arrival. Hx afib, on coumadin. Pulse check at 0943, no pulse and remains in asystole. End-tidal 18, rectal temp 98.2. No cardiac movement on US. TOD called 0945

## 2016-08-06 NOTE — ED Provider Notes (Signed)
[facility]-EMERGENCY DEPT Provider Note   CSN: [id number] Arrival date & time: April 08, 2016  0941     History   Chief Complaint Chief Complaint  Patient presents with  . Cardiac Arrest    HPI Jesse Quinn is a 75 y.o. male.  Pt presents to the ED today as a cardiac arrest.  He was found outside his house.  He was dressed for the day, and did not feel cold.  No circumstances surrounding prior this is known.  EMS coded pt for almost 50 minutes.  Please see code summary for meds and shocks.  The pt never regained a pulse.        Past Medical History:  Diagnosis Date  . Arthritis   . Atrial fibrillation (HCC)   . Dysrhythmia    AF       Patient Active Problem List   Diagnosis Date Noted  . 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 Blurred vision   . Blurred vision, bilateral   . Dim vision of both eyes 07/28/2015  . Chronic anticoagulation 07/28/2015  . Headache 07/28/2015  . Chronic a-fib (HCC) 07/28/2015  . Primary osteoarthritis of left knee 09/13/2014    Past Surgical History:  Procedure Laterality Date  . APPENDECTOMY    . TONSILLECTOMY    . 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 Authorizing Provider  acetaminophen (TYLENOL) 500 MG tablet Take 500 mg by mouth every 6 (six) hours as needed for moderate pain.    Historical Provider, MD  amoxicillin (AMOXIL) 500 MG capsule Take four (4) capsules by mouth as needed one (1) hour prior to dental procedures. Reported on 10/02/2015 05/27/15   Historical Provider, MD  DUREZOL 0.05 % EMUL Place 1 drop into the left eye 4 (four) times daily.  09/02/15   Historical Provider, MD  warfarin (COUMADIN) 5 MG tablet Take 2.5-5 mg by mouth daily at 6 PM. 5mg  daily on Monday and Friday and 2.5mg  daily all other  days.    Historical Provider, MD    Family History Family History  Problem Relation Age of Onset  . Other Mother     stomach issues  . Heart attack Father   . Heart disease Father   . Macular degeneration Sister   . Migraines Sister   . Healthy Brother   . Other Brother     special needs    Social History Social History  Substance Use Topics  . Smoking status: Former Smoker    Types: Cigars    Quit date: 07/28/2015  . Smokeless tobacco: Never Used  . Alcohol use 0.6 oz/week    1 Cans of beer per week     Comment: weekly     Allergies   Patient has no known allergies.   Review of Systems Review of Systems  Unable to perform ROS: Patient unresponsive     Physical Exam Updated Vital Signs BP (!) 0/0   Pulse (!) 0   Temp 98.2 F (36.8 C) (Rectal)   Resp (!) 0   SpO2 (!) 0%   Physical Exam  Constitutional: He appears well-developed. He appears distressed.  HENT:  Head: Atraumatic.  Right Ear: External ear normal.  Left Ear: External ear normal.  Nose: Nose normal.  Pt  orally intubated  Eyes: Conjunctivae are normal. Right pupil is not reactive. Left pupil is not reactive.  Cardiovascular:  No pulse  Pulmonary/Chest:  No spontaneous respirations.  Abdominal: Soft. Bowel sounds are normal.  Neurological: He is unresponsive.  Nursing note and vitals reviewed.    ED Treatments / Results  Labs (all labs ordered are listed, but only abnormal results are displayed) Labs Reviewed - No data to display  EKG  EKG Interpretation None       Radiology No results found.  Procedures Procedures (including critical care time)  Medications Ordered in ED Medications  EPINEPHrine (ADRENALIN) 1 MG/10ML injection (1 mg Intravenous Given 09-Feb-2016 0940)     Initial Impression / Assessment and Plan / ED Course  I have reviewed the triage vital signs and the nursing notes.  Pertinent labs & imaging results that were available during my care of the  patient were reviewed by me and considered in my medical decision making (see chart for details).  Clinical Course     Pt's temp verified as not hypothermic.  Pt has received CPR for 50 minutes.  He has received multiple epis.  He did get 2 shocks.  He did receive 300 mg of amiodarone.  He was orally intubated.  Bedside US shows no cardiac activity.  Code called.  Pt d/w Dr. Valentina LucksGriffin (PCP) who will sign death certificate if pt's wife does not object.  She does not.  I spoke with the pt's wife who is here with a neighbor.  She could not get ahold of her son.  I was able to get ahold of patient's son at work.  He is now on his way here.  Final Clinical Impressions(s) / ED Diagnoses   Final diagnoses:  Cardiac arrest [facility](HCC)    New Prescriptions New Prescriptions   No medications on file     Jacalyn LefevreJulie Janice Bodine, MD 09-Feb-2016 1026

## 2016-08-06 NOTE — Code Documentation (Signed)
Patient time of death occurred at 0945. 

## 2016-08-06 DEATH — deceased

## 2017-02-21 IMAGING — CT CT ANGIO NECK
1 of 12 series · 1 of 33 positions shown · IV contrast (Iohexol (Omnipaque 350))
Comparison: MRI brain and MRA head 07/28/2015.

CLINICAL DATA: Sudden onset of headache and vision loss yesterday.
Vision has since improved. Persistent dull headache.



[Series 300: locator · axial · 0.49mm/px · 1 of 1 slices shown]
[im 1/1  soft-tissue]
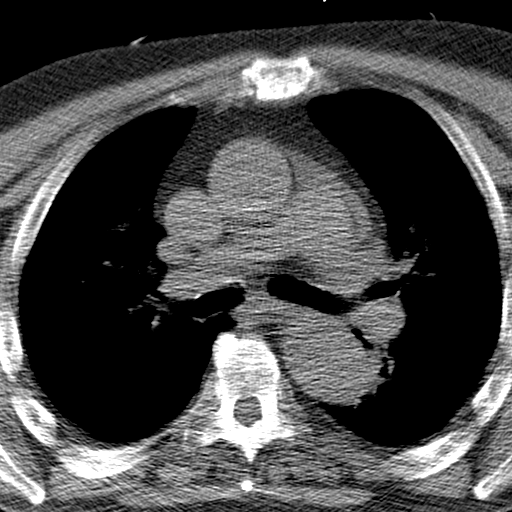

[1 of 33 positions shown; findings below may reference images not displayed]

FINDINGS: CT HEAD

Brain: The remote right cerebellar infarct is again noted. The basal
ganglia are intact. The insular ribbon is within normal limits. No
acute cortical infarct is present. No acute hemorrhage or mass
lesion is present. The ventricles are of normal size. No significant
extra-axial fluid collection is present.

Calvarium and skull base: Negative

Paranasal sinuses: Clear

Orbits: Within normal limits

CTA NECK

Aortic arch: There is a common origin of the left common carotid
artery in the innominate artery. The left subclavian artery origin
is within normal limits.

Right carotid system: The right common carotid artery is tortuous
without significant stenosis. Atherosclerotic calcifications are
present at the right carotid bifurcation without a significant
stenosis relative to the more distal vessel. There is significant
tortuosity of the distal cervical right ICA without significant
stenosis.

Left carotid system: Proximal tortuosity is present in the left
common carotid artery. Atherosclerotic calcifications are present at
the carotid bifurcation. Moderate tortuosity is present in the
cervical left ICA without significant stenosis.

Vertebral arteries:The vertebral arteries originate from the
subclavian arteries bilaterally without significant stenosis. There
is no significant focal stenosis or vascular injury in the neck.

Skeleton: Multilevel endplate degenerative change in uncovertebral
disease is present throughout the neck cervical spine. No focal
lytic or blastic lesions are present.

Other neck: The neck soft tissues of the neck are otherwise
unremarkable. No focal mucosal or submucosal lesions are present.
There is no significant adenopathy. See the salivary glands are
within normal limits. The thyroid is normal.

Mild dependent atelectasis is present. There is no focal nodule,
mass, or airspace disease.

CTA HEAD

Anterior circulation: The internal carotid arteries are within
normal limits bilaterally. The A1 and M1 segments are intact. The
anterior communicating artery is patent. The MCA bifurcations are
within normal limits bilaterally. There is some attenuation of
distal MCA branch vessels bilaterally without a significant proximal
stenosis or occlusion. There is no aneurysm. The ophthalmic artery
origins are visualized and normal.

Posterior circulation: The left vertebral artery is the dominant
vessel. The right PICA origin is visualized and normal. Both PICA
artery origins are visualized and within normal limits. There is a
fenestration at the vertebrobasilar junction without associated
aneurysm. The remainder of the basilar artery is within normal
limits. Both posterior cerebral arteries originate from the basilar
tip. There is some attenuation of distal PCA branch vessels.

Venous sinuses: The dural sinuses are patent. The right transverse
sinus is dominant. The straight sinus deep cerebral veins are
intact. The cortical veins are within normal limits.

Anatomic variants: None

Delayed phase: The postcontrast images demonstrate no pathologic
enhancement.
IMPRESSION: 1. No acute or focal lesion to explain the patient's vision loss
were headaches.
2. Remote inferior right cerebellar infarcts.
3. Fenestration of the vertebrobasilar junction without aneurysm.
4. Mild atherosclerotic calcifications at the carotid bifurcations
bilaterally without significant stenosis.
5. Marked tortuosity of the cervical internal carotid arteries,
right greater than left, without significant associated stenosis.
6. No significant disease at the level of the ophthalmic artery
origins.
